# Patient Record
Sex: Male | Born: 1960 | Race: Black or African American | Hispanic: No | Marital: Married | Smoking: Never smoker
Health system: Southern US, Community
[De-identification: ages and names within clinical notes are randomized; demographics above are authoritative.]

## PROBLEM LIST (undated history)

## (undated) DIAGNOSIS — R569 Unspecified convulsions: Secondary | ICD-10-CM

## (undated) DIAGNOSIS — R519 Headache, unspecified: Secondary | ICD-10-CM

## (undated) DIAGNOSIS — R51 Headache: Secondary | ICD-10-CM

## (undated) DIAGNOSIS — J40 Bronchitis, not specified as acute or chronic: Secondary | ICD-10-CM

## (undated) HISTORY — DX: Unspecified convulsions: R56.9

## (undated) HISTORY — PX: EYE SURGERY: SHX253

## (undated) HISTORY — DX: Bronchitis, not specified as acute or chronic: J40

## (undated) HISTORY — DX: Headache: R51

## (undated) HISTORY — DX: Headache, unspecified: R51.9

## (undated) HISTORY — PX: WISDOM TOOTH EXTRACTION: SHX21

---

## 1998-08-16 ENCOUNTER — Emergency Department (HOSPITAL_COMMUNITY): Admission: EM | Admit: 1998-08-16 | Discharge: 1998-08-16 | Payer: Self-pay | Admitting: Emergency Medicine

## 2000-06-27 ENCOUNTER — Encounter: Payer: Self-pay | Admitting: Internal Medicine

## 2000-06-27 ENCOUNTER — Ambulatory Visit (HOSPITAL_COMMUNITY): Admission: RE | Admit: 2000-06-27 | Discharge: 2000-06-27 | Payer: Self-pay | Admitting: Internal Medicine

## 2001-03-12 ENCOUNTER — Encounter: Payer: Self-pay | Admitting: Emergency Medicine

## 2001-03-12 ENCOUNTER — Emergency Department (HOSPITAL_COMMUNITY): Admission: EM | Admit: 2001-03-12 | Discharge: 2001-03-13 | Payer: Self-pay | Admitting: Emergency Medicine

## 2004-02-27 ENCOUNTER — Emergency Department (HOSPITAL_COMMUNITY): Admission: EM | Admit: 2004-02-27 | Discharge: 2004-02-27 | Payer: Self-pay | Admitting: Emergency Medicine

## 2006-05-30 ENCOUNTER — Encounter: Admission: RE | Admit: 2006-05-30 | Discharge: 2006-05-30 | Payer: Self-pay | Admitting: Family Medicine

## 2006-09-18 ENCOUNTER — Ambulatory Visit: Payer: Self-pay | Admitting: Family Medicine

## 2006-11-05 ENCOUNTER — Ambulatory Visit: Payer: Self-pay | Admitting: Family Medicine

## 2007-04-07 ENCOUNTER — Encounter: Admission: RE | Admit: 2007-04-07 | Discharge: 2007-04-07 | Payer: Self-pay | Admitting: Internal Medicine

## 2007-04-07 ENCOUNTER — Ambulatory Visit: Payer: Self-pay | Admitting: Internal Medicine

## 2007-05-06 ENCOUNTER — Ambulatory Visit: Payer: Self-pay | Admitting: Internal Medicine

## 2007-05-06 DIAGNOSIS — L02818 Cutaneous abscess of other sites: Secondary | ICD-10-CM

## 2007-05-06 DIAGNOSIS — J029 Acute pharyngitis, unspecified: Secondary | ICD-10-CM

## 2007-05-06 DIAGNOSIS — L03818 Cellulitis of other sites: Secondary | ICD-10-CM

## 2007-05-20 ENCOUNTER — Ambulatory Visit: Payer: Self-pay | Admitting: Family Medicine

## 2007-05-20 DIAGNOSIS — L255 Unspecified contact dermatitis due to plants, except food: Secondary | ICD-10-CM

## 2007-09-04 ENCOUNTER — Ambulatory Visit: Payer: Self-pay | Admitting: Family Medicine

## 2007-09-04 DIAGNOSIS — L259 Unspecified contact dermatitis, unspecified cause: Secondary | ICD-10-CM

## 2007-09-30 ENCOUNTER — Ambulatory Visit: Payer: Self-pay | Admitting: Family Medicine

## 2008-05-11 ENCOUNTER — Ambulatory Visit: Payer: Self-pay | Admitting: Family Medicine

## 2008-05-11 DIAGNOSIS — H6690 Otitis media, unspecified, unspecified ear: Secondary | ICD-10-CM | POA: Insufficient documentation

## 2012-01-22 ENCOUNTER — Encounter: Payer: Self-pay | Admitting: Internal Medicine

## 2012-01-22 ENCOUNTER — Ambulatory Visit (INDEPENDENT_AMBULATORY_CARE_PROVIDER_SITE_OTHER): Payer: BC Managed Care – PPO | Admitting: Internal Medicine

## 2012-01-22 VITALS — BP 126/80 | HR 81 | Temp 98.2°F | Wt 210.8 lb

## 2012-01-22 DIAGNOSIS — L259 Unspecified contact dermatitis, unspecified cause: Secondary | ICD-10-CM

## 2012-01-22 DIAGNOSIS — H02849 Edema of unspecified eye, unspecified eyelid: Secondary | ICD-10-CM

## 2012-01-22 DIAGNOSIS — H02842 Edema of right lower eyelid: Secondary | ICD-10-CM

## 2012-01-22 NOTE — Patient Instructions (Signed)
Take a nonsedating allergy medicine such as Allegra 160 mg daily and apply Cortaid twice a day to the itchy area. Report warning signs as discussed; pain, pus, and fever.

## 2012-01-22 NOTE — Progress Notes (Signed)
  Subjective:    Patient ID: Manuel Foster, male    DOB: 25-Jan-1961, 51 y.o.   MRN: [id number]  HPI 01/19/2012 he noted itching of the right lower lid. After scratching it he noted swelling which has progressed over the last several days. He also noted some soreness inside the right ear without discharge. His wife did rub lanacaine  on the lower lid because of the itching.  He had a similar episode in the left eye area while living in the [place] in 2010. He received a shot of an unknown medication which resolved the symptoms  He denies any blurred vision, double vision or loss of vision. He's had no purulent discharge from the eye or fever, chills, or sweats. He also denies frontal headache, facial pain, nasal periods, dental pain, or sore throat.  He's had no swelling of his lips or tongue. He's also had no sneezing,wheezing or shortness of breath  He has not been taking nonsteroidals and is not on ACE inhibitor   Review of Systems     Objective:   Physical Exam  General appearance:good health ;well nourished; no acute distress or increased work of breathing is present.  No  lymphadenopathy about the head, neck, or axilla noted.   Eyes: No conjunctival inflammation ; mild OD lower lid edema is present. There is a mild granular texture to the lower lid. Extraocular motion is intact as is vision direct testing. There is slight ptosis on the left. There is no scleral icterus.  Ears:  External ear exam shows no significant lesions or deformities.  Otoscopic examination reveals clear canals, tympanic membranes are intact bilaterally without bulging, retraction, inflammation or discharge.  Nose:  External nasal examination shows no deformity or inflammation. Nasal mucosa are pink and moist without lesions or exudates. No septal dislocation or deviation.No obstruction to airflow.   Oral exam: Dental hygiene is good; lips and gums are healthy appearing.There is no oropharyngeal erythema or  exudate noted.   Neck:  No deformities, thyromegaly, masses, or tenderness noted.   Supple with full range of motion without pain.   Heart:  Normal rate and regular rhythm. S1 and S2 normal without gallop, murmur, click, rub or other extra sounds.   Lungs:Chest clear to auscultation; no wheezes, rhonchi,rales ,or rubs present.No increased work of breathing.    Extremities:  No cyanosis, edema, or clubbing  noted    Skin: Warm & dry ; see OD lower lid changes       Assessment & Plan:   #1 itching edema of the right lower lid; superimposed granular changes are most likely related to the topical Lanacane application. There is no evidence of conjunctivitis or significant angioedema.  Plan: See orders & recommendations

## 2014-09-20 ENCOUNTER — Ambulatory Visit: Payer: BC Managed Care – PPO | Admitting: Internal Medicine

## 2014-11-04 ENCOUNTER — Ambulatory Visit: Payer: BC Managed Care – PPO | Admitting: Internal Medicine

## 2014-12-06 ENCOUNTER — Ambulatory Visit: Payer: BC Managed Care – PPO | Admitting: Internal Medicine

## 2015-02-10 ENCOUNTER — Telehealth: Payer: Self-pay

## 2015-02-10 NOTE — Telephone Encounter (Signed)
Left a message for call back.  

## 2015-02-13 ENCOUNTER — Encounter: Payer: Self-pay | Admitting: Family Medicine

## 2015-02-13 ENCOUNTER — Ambulatory Visit (INDEPENDENT_AMBULATORY_CARE_PROVIDER_SITE_OTHER): Payer: BLUE CROSS/BLUE SHIELD | Admitting: Family Medicine

## 2015-02-13 VITALS — BP 136/84 | HR 63 | Temp 98.3°F | Ht 67.0 in | Wt 198.8 lb

## 2015-02-13 DIAGNOSIS — Z23 Encounter for immunization: Secondary | ICD-10-CM

## 2015-02-13 DIAGNOSIS — Z0000 Encounter for general adult medical examination without abnormal findings: Secondary | ICD-10-CM

## 2015-02-13 DIAGNOSIS — R195 Other fecal abnormalities: Secondary | ICD-10-CM

## 2015-02-13 NOTE — Patient Instructions (Signed)
Preventive Care for Adults A healthy lifestyle and preventive care can promote health and wellness. Preventive health guidelines for men include the following key practices:  A routine yearly physical is a good way to check with your health care provider about your health and preventative screening. It is a chance to share any concerns and updates on your health and to receive a thorough exam.  Visit your dentist for a routine exam and preventative care every 6 months. Brush your teeth twice a day and floss once a day. Good oral hygiene prevents tooth decay and gum disease.  The frequency of eye exams is based on your age, health, family medical history, use of contact lenses, and other factors. Follow your health care provider's recommendations for frequency of eye exams.  Eat a healthy diet. Foods such as vegetables, fruits, whole grains, low-fat dairy products, and lean protein foods contain the nutrients you need without too many calories. Decrease your intake of foods high in solid fats, added sugars, and salt. Eat the right amount of calories for you.Get information about a proper diet from your health care provider, if necessary.  Regular physical exercise is one of the most important things you can do for your health. Most adults should get at least 150 minutes of moderate-intensity exercise (any activity that increases your heart rate and causes you to sweat) each week. In addition, most adults need muscle-strengthening exercises on 2 or more days a week.  Maintain a healthy weight. The body mass index (BMI) is a screening tool to identify possible weight problems. It provides an estimate of body fat based on height and weight. Your health care provider can find your BMI and can help you achieve or maintain a healthy weight.For adults 20 years and older:  A BMI below 18.5 is considered underweight.  A BMI of 18.5 to 24.9 is normal.  A BMI of 25 to 29.9 is considered overweight.  A BMI  of 30 and above is considered obese.  Maintain normal blood lipids and cholesterol levels by exercising and minimizing your intake of saturated fat. Eat a balanced diet with plenty of fruit and vegetables. Blood tests for lipids and cholesterol should begin at age 50 and be repeated every 5 years. If your lipid or cholesterol levels are high, you are over 50, or you are at high risk for heart disease, you may need your cholesterol levels checked more frequently.Ongoing high lipid and cholesterol levels should be treated with medicines if diet and exercise are not working.  If you smoke, find out from your health care provider how to quit. If you do not use tobacco, do not start.  Lung cancer screening is recommended for adults aged 73-80 years who are at high risk for developing lung cancer because of a history of smoking. A yearly low-dose CT scan of the lungs is recommended for people who have at least a 30-pack-year history of smoking and are a current smoker or have quit within the past 15 years. A pack year of smoking is smoking an average of 1 pack of cigarettes a day for 1 year (for example: 1 pack a day for 30 years or 2 packs a day for 15 years). Yearly screening should continue until the smoker has stopped smoking for at least 15 years. Yearly screening should be stopped for people who develop a health problem that would prevent them from having lung cancer treatment.  If you choose to drink alcohol, do not have more than  2 drinks per day. One drink is considered to be 12 ounces (355 mL) of beer, 5 ounces (148 mL) of wine, or 1.5 ounces (44 mL) of liquor.  Avoid use of street drugs. Do not share needles with anyone. Ask for help if you need support or instructions about stopping the use of drugs.  High blood pressure causes heart disease and increases the risk of stroke. Your blood pressure should be checked at least every 1-2 years. Ongoing high blood pressure should be treated with  medicines, if weight loss and exercise are not effective.  If you are 45-79 years old, ask your health care provider if you should take aspirin to prevent heart disease.  Diabetes screening involves taking a blood sample to check your fasting blood sugar level. This should be done once every 3 years, after age 45, if you are within normal weight and without risk factors for diabetes. Testing should be considered at a younger age or be carried out more frequently if you are overweight and have at least 1 risk factor for diabetes.  Colorectal cancer can be detected and often prevented. Most routine colorectal cancer screening begins at the age of 50 and continues through age 75. However, your health care provider may recommend screening at an earlier age if you have risk factors for colon cancer. On a yearly basis, your health care provider may provide home test kits to check for hidden blood in the stool. Use of a small camera at the end of a tube to directly examine the colon (sigmoidoscopy or colonoscopy) can detect the earliest forms of colorectal cancer. Talk to your health care provider about this at age 50, when routine screening begins. Direct exam of the colon should be repeated every 5-10 years through age 75, unless early forms of precancerous polyps or small growths are found.  People who are at an increased risk for hepatitis B should be screened for this virus. You are considered at high risk for hepatitis B if:  You were born in a country where hepatitis B occurs often. Talk with your health care provider about which countries are considered high risk.  Your parents were born in a high-risk country and you have not received a shot to protect against hepatitis B (hepatitis B vaccine).  You have HIV or AIDS.  You use needles to inject street drugs.  You live with, or have sex with, someone who has hepatitis B.  You are a man who has sex with other men (MSM).  You get hemodialysis  treatment.  You take certain medicines for conditions such as cancer, organ transplantation, and autoimmune conditions.  Hepatitis C blood testing is recommended for all people born from 1945 through 1965 and any individual with known risks for hepatitis C.  Practice safe sex. Use condoms and avoid high-risk sexual practices to reduce the spread of sexually transmitted infections (STIs). STIs include gonorrhea, chlamydia, syphilis, trichomonas, herpes, HPV, and human immunodeficiency virus (HIV). Herpes, HIV, and HPV are viral illnesses that have no cure. They can result in disability, cancer, and death.  If you are at risk of being infected with HIV, it is recommended that you take a prescription medicine daily to prevent HIV infection. This is called preexposure prophylaxis (PrEP). You are considered at risk if:  You are a man who has sex with other men (MSM) and have other risk factors.  You are a heterosexual man, are sexually active, and are at increased risk for HIV infection.    You take drugs by injection.  You are sexually active with a partner who has HIV.  Talk with your health care provider about whether you are at high risk of being infected with HIV. If you choose to begin PrEP, you should first be tested for HIV. You should then be tested every 3 months for as long as you are taking PrEP.  A one-time screening for abdominal aortic aneurysm (AAA) and surgical repair of large AAAs by ultrasound are recommended for men ages 32 to 67 years who are current or former smokers.  Healthy men should no longer receive prostate-specific antigen (PSA) blood tests as part of routine cancer screening. Talk with your health care provider about prostate cancer screening.  Testicular cancer screening is not recommended for adult males who have no symptoms. Screening includes self-exam, a health care provider exam, and other screening tests. Consult with your health care provider about any symptoms  you have or any concerns you have about testicular cancer.  Use sunscreen. Apply sunscreen liberally and repeatedly throughout the day. You should seek shade when your shadow is shorter than you. Protect yourself by wearing long sleeves, pants, a wide-brimmed hat, and sunglasses year round, whenever you are outdoors.  Once a month, do a whole-body skin exam, using a mirror to look at the skin on your back. Tell your health care provider about new moles, moles that have irregular borders, moles that are larger than a pencil eraser, or moles that have changed in shape or color.  Stay current with required vaccines (immunizations).  Influenza vaccine. All adults should be immunized every year.  Tetanus, diphtheria, and acellular pertussis (Td, Tdap) vaccine. An adult who has not previously received Tdap or who does not know his vaccine status should receive 1 dose of Tdap. This initial dose should be followed by tetanus and diphtheria toxoids (Td) booster doses every 10 years. Adults with an unknown or incomplete history of completing a 3-dose immunization series with Td-containing vaccines should begin or complete a primary immunization series including a Tdap dose. Adults should receive a Td booster every 10 years.  Varicella vaccine. An adult without evidence of immunity to varicella should receive 2 doses or a second dose if he has previously received 1 dose.  Human papillomavirus (HPV) vaccine. Males aged 68-21 years who have not received the vaccine previously should receive the 3-dose series. Males aged 22-26 years may be immunized. Immunization is recommended through the age of 6 years for any male who has sex with males and did not get any or all doses earlier. Immunization is recommended for any person with an immunocompromised condition through the age of 49 years if he did not get any or all doses earlier. During the 3-dose series, the second dose should be obtained 4-8 weeks after the first  dose. The third dose should be obtained 24 weeks after the first dose and 16 weeks after the second dose.  Zoster vaccine. One dose is recommended for adults aged 50 years or older unless certain conditions are present.  Measles, mumps, and rubella (MMR) vaccine. Adults born before [birth year] generally are considered immune to measles and mumps. Adults born in [birth year] or later should have 1 or more doses of MMR vaccine unless there is a contraindication to the vaccine or there is laboratory evidence of immunity to each of the three diseases. A routine second dose of MMR vaccine should be obtained at least 28 days after the first dose for students attending postsecondary  schools, health care workers, or international travelers. People who received inactivated measles vaccine or an unknown type of measles vaccine during 1963-1967 should receive 2 doses of MMR vaccine. People who received inactivated mumps vaccine or an unknown type of mumps vaccine before 1979 and are at high risk for mumps infection should consider immunization with 2 doses of MMR vaccine. Unvaccinated health care workers born before 1957 who lack laboratory evidence of measles, mumps, or rubella immunity or laboratory confirmation of disease should consider measles and mumps immunization with 2 doses of MMR vaccine or rubella immunization with 1 dose of MMR vaccine.  Pneumococcal 13-valent conjugate (PCV13) vaccine. When indicated, a person who is uncertain of his immunization history and has no record of immunization should receive the PCV13 vaccine. An adult aged 19 years or older who has certain medical conditions and has not been previously immunized should receive 1 dose of PCV13 vaccine. This PCV13 should be followed with a dose of pneumococcal polysaccharide (PPSV23) vaccine. The PPSV23 vaccine dose should be obtained at least 8 weeks after the dose of PCV13 vaccine. An adult aged 19 years or older who has certain medical conditions and  previously received 1 or more doses of PPSV23 vaccine should receive 1 dose of PCV13. The PCV13 vaccine dose should be obtained 1 or more years after the last PPSV23 vaccine dose.  Pneumococcal polysaccharide (PPSV23) vaccine. When PCV13 is also indicated, PCV13 should be obtained first. All adults aged 65 years and older should be immunized. An adult younger than age 65 years who has certain medical conditions should be immunized. Any person who resides in a nursing home or long-term care facility should be immunized. An adult smoker should be immunized. People with an immunocompromised condition and certain other conditions should receive both PCV13 and PPSV23 vaccines. People with human immunodeficiency virus (HIV) infection should be immunized as soon as possible after diagnosis. Immunization during chemotherapy or radiation therapy should be avoided. Routine use of PPSV23 vaccine is not recommended for American Indians, [place] Natives, or people younger than 65 years unless there are medical conditions that require PPSV23 vaccine. When indicated, people who have unknown immunization and have no record of immunization should receive PPSV23 vaccine. One-time revaccination 5 years after the first dose of PPSV23 is recommended for people aged 19-64 years who have chronic kidney failure, nephrotic syndrome, asplenia, or immunocompromised conditions. People who received 1-2 doses of PPSV23 before age 65 years should receive another dose of PPSV23 vaccine at age 65 years or later if at least 5 years have passed since the previous dose. Doses of PPSV23 are not needed for people immunized with PPSV23 at or after age 65 years.  Meningococcal vaccine. Adults with asplenia or persistent complement component deficiencies should receive 2 doses of quadrivalent meningococcal conjugate (MenACWY-D) vaccine. The doses should be obtained at least 2 months apart. Microbiologists working with certain meningococcal bacteria,  military recruits, people at risk during an outbreak, and people who travel to or live in countries with a high rate of meningitis should be immunized. A first-year college student up through age 21 years who is living in a residence hall should receive a dose if he did not receive a dose on or after his 16th birthday. Adults who have certain high-risk conditions should receive one or more doses of vaccine.  Hepatitis A vaccine. Adults who wish to be protected from this disease, have certain high-risk conditions, work with hepatitis A-infected animals, work in hepatitis A research labs, or   travel to or work in countries with a high rate of hepatitis A should be immunized. Adults who were previously unvaccinated and who anticipate close contact with an international adoptee during the first 60 days after arrival in the [place] from a country with a high rate of hepatitis A should be immunized.  Hepatitis B vaccine. Adults should be immunized if they wish to be protected from this disease, have certain high-risk conditions, may be exposed to blood or other infectious body fluids, are household contacts or sex partners of hepatitis B positive people, are clients or workers in certain care facilities, or travel to or work in countries with a high rate of hepatitis B.  Haemophilus influenzae type b (Hib) vaccine. A previously unvaccinated person with asplenia or sickle cell disease or having a scheduled splenectomy should receive 1 dose of Hib vaccine. Regardless of previous immunization, a recipient of a hematopoietic stem cell transplant should receive a 3-dose series 6-12 months after his successful transplant. Hib vaccine is not recommended for adults with HIV infection. Preventive Service / Frequency Ages 52 to 17  Blood pressure check.** / Every 1 to 2 years.  Lipid and cholesterol check.** / Every 5 years beginning at age 69.  Hepatitis C blood test.** / For any individual with known risks for  hepatitis C.  Skin self-exam. / Monthly.  Influenza vaccine. / Every year.  Tetanus, diphtheria, and acellular pertussis (Tdap, Td) vaccine.** / Consult your health care provider. 1 dose of Td every 10 years.  Varicella vaccine.** / Consult your health care provider.  HPV vaccine. / 3 doses over 6 months, if 72 or younger.  Measles, mumps, rubella (MMR) vaccine.** / You need at least 1 dose of MMR if you were born in 1957 or later. You may also need a second dose.  Pneumococcal 13-valent conjugate (PCV13) vaccine.** / Consult your health care provider.  Pneumococcal polysaccharide (PPSV23) vaccine.** / 1 to 2 doses if you smoke cigarettes or if you have certain conditions.  Meningococcal vaccine.** / 1 dose if you are age 35 to 60 years and a Market researcher living in a residence hall, or have one of several medical conditions. You may also need additional booster doses.  Hepatitis A vaccine.** / Consult your health care provider.  Hepatitis B vaccine.** / Consult your health care provider.  Haemophilus influenzae type b (Hib) vaccine.** / Consult your health care provider. Ages 35 to 8  Blood pressure check.** / Every 1 to 2 years.  Lipid and cholesterol check.** / Every 5 years beginning at age 57.  Lung cancer screening. / Every year if you are aged 44-80 years and have a 30-pack-year history of smoking and currently smoke or have quit within the past 15 years. Yearly screening is stopped once you have quit smoking for at least 15 years or develop a health problem that would prevent you from having lung cancer treatment.  Fecal occult blood test (FOBT) of stool. / Every year beginning at age 55 and continuing until age 73. You may not have to do this test if you get a colonoscopy every 10 years.  Flexible sigmoidoscopy** or colonoscopy.** / Every 5 years for a flexible sigmoidoscopy or every 10 years for a colonoscopy beginning at age 28 and continuing until age  1.  Hepatitis C blood test.** / For all people born from [birth year] through 1965 and any individual with known risks for hepatitis C.  Skin self-exam. / Monthly.  Influenza vaccine. / Every  year.  Tetanus, diphtheria, and acellular pertussis (Tdap/Td) vaccine.** / Consult your health care provider. 1 dose of Td every 10 years.  Varicella vaccine.** / Consult your health care provider.  Zoster vaccine.** / 1 dose for adults aged 53 years or older.  Measles, mumps, rubella (MMR) vaccine.** / You need at least 1 dose of MMR if you were born in 1957 or later. You may also need a second dose.  Pneumococcal 13-valent conjugate (PCV13) vaccine.** / Consult your health care provider.  Pneumococcal polysaccharide (PPSV23) vaccine.** / 1 to 2 doses if you smoke cigarettes or if you have certain conditions.  Meningococcal vaccine.** / Consult your health care provider.  Hepatitis A vaccine.** / Consult your health care provider.  Hepatitis B vaccine.** / Consult your health care provider.  Haemophilus influenzae type b (Hib) vaccine.** / Consult your health care provider. Ages 77 and over  Blood pressure check.** / Every 1 to 2 years.  Lipid and cholesterol check.**/ Every 5 years beginning at age 85.  Lung cancer screening. / Every year if you are aged 55-80 years and have a 30-pack-year history of smoking and currently smoke or have quit within the past 15 years. Yearly screening is stopped once you have quit smoking for at least 15 years or develop a health problem that would prevent you from having lung cancer treatment.  Fecal occult blood test (FOBT) of stool. / Every year beginning at age 33 and continuing until age 11. You may not have to do this test if you get a colonoscopy every 10 years.  Flexible sigmoidoscopy** or colonoscopy.** / Every 5 years for a flexible sigmoidoscopy or every 10 years for a colonoscopy beginning at age 28 and continuing until age 73.  Hepatitis C blood  test.** / For all people born from [birth year] through 1965 and any individual with known risks for hepatitis C.  Abdominal aortic aneurysm (AAA) screening.** / A one-time screening for ages 50 to 27 years who are current or former smokers.  Skin self-exam. / Monthly.  Influenza vaccine. / Every year.  Tetanus, diphtheria, and acellular pertussis (Tdap/Td) vaccine.** / 1 dose of Td every 10 years.  Varicella vaccine.** / Consult your health care provider.  Zoster vaccine.** / 1 dose for adults aged 34 years or older.  Pneumococcal 13-valent conjugate (PCV13) vaccine.** / Consult your health care provider.  Pneumococcal polysaccharide (PPSV23) vaccine.** / 1 dose for all adults aged 63 years and older.  Meningococcal vaccine.** / Consult your health care provider.  Hepatitis A vaccine.** / Consult your health care provider.  Hepatitis B vaccine.** / Consult your health care provider.  Haemophilus influenzae type b (Hib) vaccine.** / Consult your health care provider. **Family history and personal history of risk and conditions may change your health care provider's recommendations. Document Released: 01/14/2002 Document Revised: 11/23/2013 Document Reviewed: 04/15/2011 [facility] Patient Information 2015 [place], [place]. This information is not intended to replace advice given to you by your health care provider. Make sure you discuss any questions you have with your health care provider.

## 2015-02-13 NOTE — Progress Notes (Signed)
Patient ID: Manuel Foster, male    DOB: Feb 19, 1961  Age: 54 y.o. MRN: [id number]    Subjective:  Subjective HPI EDDER Foster presents for cpe  Review of Systems  Constitutional: Negative.   HENT: Negative for congestion, ear pain, hearing loss, nosebleeds, postnasal drip, rhinorrhea, sinus pressure, sneezing and tinnitus.   Eyes: Negative for photophobia, discharge, itching and visual disturbance.  Respiratory: Negative.   Cardiovascular: Negative.   Gastrointestinal: Negative for abdominal pain, constipation, blood in stool, abdominal distention and anal bleeding.  Endocrine: Negative.   Genitourinary: Negative.   Musculoskeletal: Negative.   Skin: Negative.   Allergic/Immunologic: Negative.   Neurological: Negative for dizziness, weakness, light-headedness, numbness and headaches.  Psychiatric/Behavioral: Negative for suicidal ideas, confusion, sleep disturbance, dysphoric mood, decreased concentration and agitation. The patient is not nervous/anxious.     History Past Medical History  Diagnosis Date  . Chicken pox   . Frequent headaches   . Seizures     As a child    He has past surgical history that includes Eye surgery (Left) and Wisdom tooth extraction.   His family history includes Alcohol abuse in his brother and brother; Diabetes in his father and sister; Stroke in his mother.He reports that he has never smoked. He does not have any smokeless tobacco history on file. He reports that he does not drink alcohol or use illicit drugs.  No current outpatient prescriptions on file prior to visit.   No current facility-administered medications on file prior to visit.     Objective:  Objective Physical Exam  Constitutional: He is oriented to person, place, and time. He appears well-developed and well-nourished. No distress.  HENT:  Head: Normocephalic and atraumatic.  Right Ear: External ear normal.  Left Ear: External ear normal.  Nose: Nose normal.    Mouth/Throat: Oropharynx is clear and moist. No oropharyngeal exudate.  Eyes: Conjunctivae and EOM are normal. Pupils are equal, round, and reactive to light. Right eye exhibits no discharge. Left eye exhibits no discharge.  Neck: Normal range of motion. Neck supple. No JVD present. No thyromegaly present.  Cardiovascular: Normal rate, regular rhythm, normal heart sounds and intact distal pulses.  Exam reveals no gallop and no friction rub.   No murmur heard. Pulmonary/Chest: Effort normal and breath sounds normal. No respiratory distress. He has no wheezes. He has no rales. He exhibits no tenderness.  Abdominal: Soft. Bowel sounds are normal. He exhibits no distension and no mass. There is no tenderness. There is no rebound and no guarding.  Genitourinary: Prostate normal and penis normal. Guaiac positive stool.  Musculoskeletal: Normal range of motion. He exhibits no edema or tenderness.  Lymphadenopathy:    He has no cervical adenopathy.  Neurological: He is alert and oriented to person, place, and time. He displays normal reflexes. He exhibits normal muscle tone.  Skin: Skin is warm and dry. No rash noted. He is not diaphoretic. No erythema. No pallor.  Psychiatric: He has a normal mood and affect. His behavior is normal. Judgment and thought content normal.   BP 136/84 mmHg  Pulse 63  Temp(Src) 98.3 F (36.8 C) (Oral)  Ht  (1.702 m)  Wt 198 lb 12.8 oz (90.175 kg)  BMI 31.13 kg/m2  SpO2 97% Wt Readings from Last 3 Encounters:  02/13/15 198 lb 12.8 oz (90.175 kg)  01/22/12 210 lb 12.8 oz (95.618 kg)  05/11/08 201 lb 8 oz (91.4 kg)     No results found for: WBC, HGB, HCT,  PLT, GLUCOSE, CHOL, TRIG, HDL, LDLDIRECT, LDLCALC, ALT, AST, NA, K, CL, CREATININE, BUN, CO2, TSH, PSA, INR, GLUF, HGBA1C, MICROALBUR  No results found.   Assessment & Plan:  Plan Manuel Foster does not currently have medications on file.  No orders of the defined types were placed in this encounter.     Problem List Items Addressed This Visit    None    Visit Diagnoses    Preventative health care    -  Primary    Relevant Orders    Basic metabolic panel    CBC with Differential/Platelet    Hepatic function panel    Lipid panel    POCT urinalysis dipstick    TSH    Need for diphtheria-tetanus-pertussis (Tdap) vaccine, adult/adolescent        Relevant Orders    Tdap vaccine greater than or equal to 7yo IM (Completed)    Heme positive stool        Relevant Orders    Ambulatory referral to Gastroenterology    Encounter for immunization           Follow-up: Return in about 1 year (around 02/13/2016), or if symptoms worsen or fail to improve, for labs in next few weeks.  Manuel FreudYvonne Lowne, DO

## 2015-02-13 NOTE — Progress Notes (Signed)
Pre visit review using our clinic review tool, if applicable. No additional management support is needed unless otherwise documented below in the visit note. 

## 2015-02-16 NOTE — Telephone Encounter (Signed)
Unable to reach patient prior to visit  

## 2015-03-01 ENCOUNTER — Other Ambulatory Visit: Payer: BLUE CROSS/BLUE SHIELD

## 2015-03-07 ENCOUNTER — Encounter: Payer: Self-pay | Admitting: Family Medicine

## 2015-03-07 ENCOUNTER — Ambulatory Visit (INDEPENDENT_AMBULATORY_CARE_PROVIDER_SITE_OTHER): Payer: BLUE CROSS/BLUE SHIELD | Admitting: Family Medicine

## 2015-03-07 ENCOUNTER — Other Ambulatory Visit (INDEPENDENT_AMBULATORY_CARE_PROVIDER_SITE_OTHER): Payer: BLUE CROSS/BLUE SHIELD

## 2015-03-07 VITALS — BP 126/80 | HR 69 | Temp 98.1°F | Resp 16 | Ht 67.0 in | Wt 195.0 lb

## 2015-03-07 DIAGNOSIS — R208 Other disturbances of skin sensation: Secondary | ICD-10-CM

## 2015-03-07 DIAGNOSIS — R200 Anesthesia of skin: Secondary | ICD-10-CM

## 2015-03-07 DIAGNOSIS — Z0000 Encounter for general adult medical examination without abnormal findings: Secondary | ICD-10-CM

## 2015-03-07 NOTE — Progress Notes (Signed)
Pre visit review using our clinic review tool, if applicable. No additional management support is needed unless otherwise documented below in the visit note. 

## 2015-03-07 NOTE — Progress Notes (Signed)
   Subjective:    Patient ID: Manuel HumanRandy D Ruda, male    DOB: Oct 05, 1961, 54 y.o.   MRN: [id number]  HPI  Patient here c/o numbness in L hand 3 and 4 fingers-- mostly 3rd No injury---noticed it a few weeks ago--worsening since  Past Medical History  Diagnosis Date  . Chicken pox   . Frequent headaches   . Seizures     As a child    Review of Systems  Constitutional: Negative for fatigue and unexpected weight change.  Respiratory: Negative for cough and shortness of breath.   Cardiovascular: Negative for chest pain and palpitations.  Neurological: Positive for numbness.    No current outpatient prescriptions on file prior to visit.   No current facility-administered medications on file prior to visit.       Objective:    Physical Exam  Constitutional: He appears well-developed and well-nourished. No distress.  Cardiovascular: Normal rate, regular rhythm and normal heart sounds.   Pulmonary/Chest: Effort normal and breath sounds normal. No respiratory distress.  Musculoskeletal:       Arms: Psychiatric: He has a normal mood and affect. His behavior is normal. Judgment and thought content normal.    BP 126/80 mmHg  Pulse 69  Temp(Src) 98.1 F (36.7 C) (Oral)  Resp 16  Ht 5\' 7"  (1.702 m)  Wt 195 lb (88.451 kg)  BMI 30.53 kg/m2  SpO2 98% Wt Readings from Last 3 Encounters:  03/07/15 195 lb (88.451 kg)  02/13/15 198 lb 12.8 oz (90.175 kg)  01/22/12 210 lb 12.8 oz (95.618 kg)     No results found for: WBC, HGB, HCT, PLT, GLUCOSE, CHOL, TRIG, HDL, LDLDIRECT, LDLCALC, ALT, AST, NA, K, CL, CREATININE, BUN, CO2, TSH, PSA, INR, GLUF, HGBA1C, MICROALBUR     Assessment & Plan:   Problem List Items Addressed This Visit    None    Visit Diagnoses    Numbness of left hand    -  Primary    Relevant Orders    Ambulatory referral to Hand Surgery       [name] does not currently have medications on file.  No orders of the defined types were placed in this  encounter.     Loreen FreudYvonne Lowne, DO

## 2015-03-08 LAB — HEPATIC FUNCTION PANEL
ALBUMIN: 3.9 g/dL (ref 3.5–5.2)
ALK PHOS: 58 U/L (ref 39–117)
ALT: 17 U/L (ref 0–53)
AST: 23 U/L (ref 0–37)
Bilirubin, Direct: 0.1 mg/dL (ref 0.0–0.3)
Total Bilirubin: 0.6 mg/dL (ref 0.2–1.2)
Total Protein: 7.5 g/dL (ref 6.0–8.3)

## 2015-03-08 LAB — BASIC METABOLIC PANEL
BUN: 14 mg/dL (ref 6–23)
CALCIUM: 9.4 mg/dL (ref 8.4–10.5)
CO2: 27 mEq/L (ref 19–32)
CREATININE: 0.94 mg/dL (ref 0.40–1.50)
Chloride: 104 mEq/L (ref 96–112)
GFR: 108.64 mL/min (ref >60.00)
GLUCOSE: 77 mg/dL (ref 70–99)
Potassium: 3.5 mEq/L (ref 3.5–5.1)
Sodium: 137 mEq/L (ref 135–145)

## 2015-03-08 LAB — LIPID PANEL
CHOL/HDL RATIO: 4
Cholesterol: 245 mg/dL — ABNORMAL HIGH (ref 0–200)
HDL: 59.3 mg/dL (ref >39.00)
LDL Cholesterol: 175 mg/dL — ABNORMAL HIGH (ref 0–99)
NONHDL: 185.7
Triglycerides: 52 mg/dL (ref 0.0–149.0)
VLDL: 10.4 mg/dL (ref 0.0–40.0)

## 2015-03-08 LAB — CBC WITH DIFFERENTIAL/PLATELET
BASOS ABS: 0.1 10*3/uL (ref 0.0–0.1)
Basophils Relative: 1.1 % (ref 0.0–3.0)
EOS ABS: 0.2 10*3/uL (ref 0.0–0.7)
Eosinophils Relative: 3.9 % (ref 0.0–5.0)
HEMATOCRIT: 37.8 % — AB (ref 39.0–52.0)
Hemoglobin: 12.8 g/dL — ABNORMAL LOW (ref 13.0–17.0)
LYMPHS ABS: 1.4 10*3/uL (ref 0.7–4.0)
Lymphocytes Relative: 32.7 % (ref 12.0–46.0)
MCHC: 33.9 g/dL (ref 30.0–36.0)
MCV: 82.8 fl (ref 78.0–100.0)
MONO ABS: 0.4 10*3/uL (ref 0.1–1.0)
Monocytes Relative: 9.3 % (ref 3.0–12.0)
Neutro Abs: 2.3 10*3/uL (ref 1.4–7.7)
Neutrophils Relative %: 53 % (ref 43.0–77.0)
PLATELETS: 222 10*3/uL (ref 150.0–400.0)
RBC: 4.56 Mil/uL (ref 4.22–5.81)
RDW: 14 % (ref 11.5–15.5)
WBC: 4.4 10*3/uL (ref 4.0–10.5)

## 2015-03-08 LAB — TSH: TSH: 1.25 u[IU]/mL (ref 0.35–4.50)

## 2015-04-24 ENCOUNTER — Ambulatory Visit (INDEPENDENT_AMBULATORY_CARE_PROVIDER_SITE_OTHER): Payer: BLUE CROSS/BLUE SHIELD | Admitting: Podiatry

## 2015-04-24 ENCOUNTER — Encounter: Payer: Self-pay | Admitting: Podiatry

## 2015-04-24 VITALS — BP 170/79 | HR 66 | Resp 13 | Ht 67.0 in | Wt 190.0 lb

## 2015-04-24 DIAGNOSIS — M2040 Other hammer toe(s) (acquired), unspecified foot: Secondary | ICD-10-CM

## 2015-04-24 DIAGNOSIS — L84 Corns and callosities: Secondary | ICD-10-CM

## 2015-04-24 MED ORDER — NAFTIFINE HCL 2 % EX CREA: TOPICAL_CREAM | CUTANEOUS | Status: DC

## 2015-04-24 NOTE — Progress Notes (Signed)
   Subjective:    Patient ID: Manuel Foster, male    DOB: [birth year], 54 y.o.   MRN: [id number]  HPI    Review of Systems  All other systems reviewed and are negative.      Objective:   Physical Exam        Assessment & Plan:

## 2015-04-25 NOTE — Progress Notes (Signed)
Subjective:     Patient ID: Manuel Foster, male   DOB: 1961/10/05, 54 y.o.   MRN: [id number]  HPI patient presents with corn callus formation on the medial side of the big toe of both feet and deviation of the hallux against the second toe bilateral along with keratotic lesion formation   Review of Systems     Objective:   Physical Exam Neurovascular status intact muscle strength adequate range of motion within normal limits. Patient's found to have distal keratotic lesion hallux bilateral that are thick and painful when pressed and deviation of the hallux bilateral    Assessment:     Structural changes with keratotic lesion formation and deviation of the big toe bilateral    Plan:     H&P and condition reviewed with patient. Debridement accomplished with no iatrogenic bleeding and instructed on padding therapy with pads been dispensed

## 2015-11-24 ENCOUNTER — Ambulatory Visit: Payer: BLUE CROSS/BLUE SHIELD | Admitting: Medical

## 2015-11-24 ENCOUNTER — Encounter: Payer: Self-pay | Admitting: Family Medicine

## 2015-11-24 ENCOUNTER — Ambulatory Visit (INDEPENDENT_AMBULATORY_CARE_PROVIDER_SITE_OTHER): Payer: BLUE CROSS/BLUE SHIELD | Admitting: Family Medicine

## 2015-11-24 VITALS — BP 140/89 | HR 74 | Temp 98.4°F | Ht 67.0 in | Wt 195.0 lb

## 2015-11-24 DIAGNOSIS — R05 Cough: Secondary | ICD-10-CM

## 2015-11-24 DIAGNOSIS — J0110 Acute frontal sinusitis, unspecified: Secondary | ICD-10-CM

## 2015-11-24 DIAGNOSIS — R059 Cough, unspecified: Secondary | ICD-10-CM

## 2015-11-24 MED ORDER — PROMETHAZINE-DM 6.25-15 MG/5ML PO SYRP: 5.0000 mL | ORAL_SOLUTION | Freq: Four times a day (QID) | ORAL | Status: DC | PRN

## 2015-11-24 MED ORDER — AMOXICILLIN-POT CLAVULANATE 875-125 MG PO TABS
1.0000 | ORAL_TABLET | Freq: Two times a day (BID) | ORAL | Status: DC
Start: 2015-11-24 — End: 2016-06-22

## 2015-11-24 MED ORDER — LEVOCETIRIZINE DIHYDROCHLORIDE 5 MG PO TABS: 5.0000 mg | ORAL_TABLET | Freq: Every evening | ORAL | Status: DC

## 2015-11-24 MED ORDER — FLUTICASONE PROPIONATE 50 MCG/ACT NA SUSP: 2.0000 | Freq: Every day | NASAL | Status: DC

## 2015-11-24 NOTE — Progress Notes (Signed)
Pre visit review using our clinic review tool, if applicable. No additional management support is needed unless otherwise documented below in the visit note. 

## 2015-11-24 NOTE — Progress Notes (Signed)
Patient ID: Manuel Foster, male    DOB: Apr 09, 1961  Age: 54 y.o. MRN: [id number]    Subjective:  Subjective HPI Manuel Foster presents for cough and congestion x 5 days.  Took robitussin and otc med with no relief.  No fever, no chills.  + sinus pressure  Review of Systems  Constitutional: Positive for chills. Negative for fever.  HENT: Positive for congestion, postnasal drip, rhinorrhea and sinus pressure.   Respiratory: Positive for cough. Negative for chest tightness, shortness of breath and wheezing.   Cardiovascular: Negative for chest pain, palpitations and leg swelling.  Allergic/Immunologic: Negative for environmental allergies.    History Past Medical History  Diagnosis Date  . Chicken pox   . Frequent headaches   . Seizures (HCC)     As a child    Manuel Foster has past surgical history that includes Eye surgery (Left) and Wisdom tooth extraction.   His family history includes Alcohol abuse in his brother and brother; Diabetes in his father and sister; Stroke in his mother.Manuel Foster reports that Manuel Foster has never smoked. Manuel Foster does not have any smokeless tobacco history on file. Manuel Foster reports that Manuel Foster does not drink alcohol or use illicit drugs.  Current Outpatient Prescriptions on File Prior to Visit  Medication Sig Dispense Refill  . Naftifine HCl 2 % CREA Apply daily to affected area. 1 Tube 11   No current facility-administered medications on file prior to visit.     Objective:  Objective Physical Exam  Constitutional: Manuel Foster is oriented to person, place, and time. Manuel Foster appears well-developed and well-nourished.  HENT:  Right Ear: External ear normal.  Left Ear: External ear normal.  Nose: Right sinus exhibits maxillary sinus tenderness and frontal sinus tenderness. Left sinus exhibits maxillary sinus tenderness and frontal sinus tenderness.  + PND + errythema  Eyes: Conjunctivae are normal. Right eye exhibits no discharge. Left eye exhibits no discharge.  Cardiovascular: Normal rate,  regular rhythm and normal heart sounds.   No murmur heard. Pulmonary/Chest: Effort normal and breath sounds normal. No respiratory distress. Manuel Foster has no wheezes. Manuel Foster has no rales. Manuel Foster exhibits no tenderness.  Musculoskeletal: Manuel Foster exhibits no edema.  Lymphadenopathy:    Manuel Foster has cervical adenopathy.  Neurological: Manuel Foster is alert and oriented to person, place, and time.  Psychiatric: Manuel Foster has a normal mood and affect. His behavior is normal. Judgment and thought content normal.  Nursing note and vitals reviewed.  BP 140/89 mmHg  Pulse 74  Temp(Src) 98.4 F (36.9 C) (Oral)  Ht  (1.702 m)  Wt 195 lb (88.451 kg)  BMI 30.53 kg/m2  SpO2 98% Wt Readings from Last 3 Encounters:  11/24/15 195 lb (88.451 kg)  04/24/15 190 lb (86.183 kg)  03/07/15 195 lb (88.451 kg)     Lab Results  Component Value Date   WBC 4.4 03/07/2015   HGB 12.8* 03/07/2015   HCT 37.8* 03/07/2015   PLT 222.0 03/07/2015   GLUCOSE 77 03/07/2015   CHOL 245* 03/07/2015   TRIG 52.0 03/07/2015   HDL 59.30 03/07/2015   LDLCALC 175* 03/07/2015   ALT 17 03/07/2015   AST 23 03/07/2015   NA 137 03/07/2015   K 3.5 03/07/2015   CL 104 03/07/2015   CREATININE 0.94 03/07/2015   BUN 14 03/07/2015   CO2 27 03/07/2015   TSH 1.25 03/07/2015    No results found.   Assessment & Plan:  Plan I am having Manuel Foster start on amoxicillin-clavulanate, fluticasone, levocetirizine, and promethazine-dextromethorphan. I am  also having him maintain his Naftifine HCl.  Meds ordered this encounter  Medications  . amoxicillin-clavulanate (AUGMENTIN) 875-125 MG tablet    Sig: Take 1 tablet by mouth 2 (two) times daily.    Dispense:  20 tablet    Refill:  0  . fluticasone (FLONASE) 50 MCG/ACT nasal spray    Sig: Place 2 sprays into both nostrils daily.    Dispense:  16 g    Refill:  6  . levocetirizine (XYZAL) 5 MG tablet    Sig: Take 1 tablet (5 mg total) by mouth every evening.    Dispense:  30 tablet    Refill:  5  .  promethazine-dextromethorphan (PROMETHAZINE-DM) 6.25-15 MG/5ML syrup    Sig: Take 5 mLs by mouth 4 (four) times daily as needed.    Dispense:  118 mL    Refill:  0    Problem List Items Addressed This Visit    None    Visit Diagnoses    Acute frontal sinusitis, recurrence not specified    -  Primary    Relevant Medications    amoxicillin-clavulanate (AUGMENTIN) 875-125 MG tablet    fluticasone (FLONASE) 50 MCG/ACT nasal spray    levocetirizine (XYZAL) 5 MG tablet    promethazine-dextromethorphan (PROMETHAZINE-DM) 6.25-15 MG/5ML syrup    Cough        Relevant Medications    promethazine-dextromethorphan (PROMETHAZINE-DM) 6.25-15 MG/5ML syrup       Follow-up: Return if symptoms worsen or fail to improve.  Loreen FreudYvonne Lowne, DO

## 2015-11-24 NOTE — Patient Instructions (Signed)

## 2016-01-11 ENCOUNTER — Other Ambulatory Visit: Payer: Self-pay

## 2016-01-11 DIAGNOSIS — J0110 Acute frontal sinusitis, unspecified: Secondary | ICD-10-CM

## 2016-01-11 MED ORDER — LEVOCETIRIZINE DIHYDROCHLORIDE 5 MG PO TABS: 5.0000 mg | ORAL_TABLET | Freq: Every evening | ORAL | Status: DC

## 2016-06-22 ENCOUNTER — Telehealth: Payer: Self-pay | Admitting: Emergency Medicine

## 2016-06-22 ENCOUNTER — Ambulatory Visit (INDEPENDENT_AMBULATORY_CARE_PROVIDER_SITE_OTHER): Payer: BLUE CROSS/BLUE SHIELD | Admitting: Urgent Care

## 2016-06-22 ENCOUNTER — Ambulatory Visit (HOSPITAL_COMMUNITY)
Admission: RE | Admit: 2016-06-22 | Discharge: 2016-06-22 | Disposition: A | Discharge status: Home / Self Care | Payer: BLUE CROSS/BLUE SHIELD | Source: Ambulatory Visit | Attending: Urgent Care | Admitting: Urgent Care

## 2016-06-22 VITALS — BP 110/64 | HR 56 | Temp 98.2°F | Resp 18 | Ht 67.0 in | Wt 189.6 lb

## 2016-06-22 DIAGNOSIS — M7989 Other specified soft tissue disorders: Secondary | ICD-10-CM

## 2016-06-22 DIAGNOSIS — M79661 Pain in right lower leg: Secondary | ICD-10-CM | POA: Diagnosis not present

## 2016-06-22 LAB — POCT CBC
Granulocyte percent: 57.2 %G (ref 37–80)
HCT, POC: 39.2 % — AB (ref 43.5–53.7)
HEMOGLOBIN: 13.3 g/dL — AB (ref 14.1–18.1)
LYMPH, POC: 2 (ref 0.6–3.4)
MCH: 28.1 pg (ref 27–31.2)
MCHC: 34 g/dL (ref 31.8–35.4)
MCV: 82.7 fL (ref 80–97)
MID (cbc): 0.6 (ref 0–0.9)
MPV: 6.4 fL (ref 0–99.8)
PLATELET COUNT, POC: 238 10*3/uL (ref 142–424)
POC Granulocyte: 3.4 (ref 2–6.9)
POC LYMPH PERCENT: 33.4 %L (ref 10–50)
POC MID %: 9.4 % (ref 0–12)
RBC: 4.74 M/uL (ref 4.69–6.13)
RDW, POC: 13.7 %
WBC: 6 10*3/uL (ref 4.6–10.2)

## 2016-06-22 LAB — COMPREHENSIVE METABOLIC PANEL
ALK PHOS: 64 U/L (ref 40–115)
ALT: 19 U/L (ref 9–46)
AST: 27 U/L (ref 10–35)
Albumin: 4.3 g/dL (ref 3.6–5.1)
BUN: 18 mg/dL (ref 7–25)
CO2: 26 mmol/L (ref 20–31)
CREATININE: 1.02 mg/dL (ref 0.70–1.33)
Calcium: 9.3 mg/dL (ref 8.6–10.3)
Chloride: 105 mmol/L (ref 98–110)
GLUCOSE: 90 mg/dL (ref 65–99)
POTASSIUM: 4.1 mmol/L (ref 3.5–5.3)
SODIUM: 138 mmol/L (ref 135–146)
TOTAL PROTEIN: 7.5 g/dL (ref 6.1–8.1)
Total Bilirubin: 0.6 mg/dL (ref 0.2–1.2)

## 2016-06-22 LAB — HIV ANTIBODY (ROUTINE TESTING W REFLEX): HIV: NONREACTIVE

## 2016-06-22 MED ORDER — CYCLOBENZAPRINE HCL 5 MG PO TABS: 5.0000 mg | ORAL_TABLET | Freq: Three times a day (TID) | ORAL | Status: DC | PRN

## 2016-06-22 MED ORDER — NAPROXEN SODIUM 550 MG PO TABS: 550.0000 mg | ORAL_TABLET | Freq: Two times a day (BID) | ORAL | Status: DC

## 2016-06-22 NOTE — Patient Instructions (Addendum)
  Go to [facility] and register in the emergency room. The tech will come get you from the lobby once you have checked in.  [address]   IF you received an x-ray today, you will receive an invoice from [facility]. Please contact [facility] at [phone] with questions or concerns regarding your invoice.   IF you received labwork today, you will receive an invoice from [employer]. Please contact [facility] at [phone] with questions or concerns regarding your invoice.   Our billing staff will not be able to assist you with questions regarding bills from these companies.  You will be contacted with the lab results as soon as they are available. The fastest way to get your results is to activate your My Chart account. Instructions are located on the last page of this paperwork. If you have not heard from [place] regarding the results in 2 weeks, please contact this office.

## 2016-06-22 NOTE — Addendum Note (Signed)
Addended by: Wallis Bamberg on: 06/22/2016 01:14 PM   Modules accepted: Orders

## 2016-06-22 NOTE — Progress Notes (Addendum)
MRN: [id number] DOB: 07-Nov-1961  Subjective:   Manuel Foster is a 55 y.o. male presenting for chief complaint of Leg Pain  Reports 3 day history of right lower leg pain, swelling. Pain is achy in nature, worse with walking but has this pain at rest too. Has not tried any medications for relief. 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 Of note, patient has had 1 episode in 1992 of sudden right lower leg pain that resolved on its own.  Denies fever, chest pain, shob, cough, redness, warmth of his calf, trauma, numbness or tingling. Denies recent history of surgeries, long distance travel. Denies smoking cigarettes or drinking alcohol. Drinks water, sodas, tea.   Manuel Foster has a current medication list which includes the following prescription(s): naftifine hcl. Also has No Known Allergies.  Manuel Foster  has a past medical history of Frequent headaches; Seizures (HCC); and Bronchitis. Also  has past surgical history that includes Eye surgery (Left) and Wisdom tooth extraction.  His family history includes Alcohol abuse in his brother and brother; Diabetes in his father and sister; Stroke in his mother.   Objective:   Vitals: BP 110/64 mmHg  Pulse 56  Temp(Src) 98.2 F (36.8 C) (Oral)  Resp 18  Ht  (1.702 m)  Wt 189 lb 9.6 oz (86.002 kg)  BMI 29.69 kg/m2  SpO2 98%  Physical Exam  Constitutional: He is oriented to person, place, and time. He appears well-developed and well-nourished.  Cardiovascular: Normal rate, regular rhythm and intact distal pulses.  Exam reveals no gallop and no friction rub.   No murmur heard. Pulmonary/Chest: No respiratory distress. He has no wheezes. He has no rales.  Musculoskeletal:       Right lower leg: He exhibits tenderness (calf, positive Homman sign) and swelling (1.5cm compared to left calf). He exhibits no bony tenderness, no deformity and no laceration.  Neurological: He is alert and oriented to person, place, and time.  Skin: Skin is warm and  dry.   Results for orders placed or performed in visit on 06/22/16 (from the past 24 hour(s))  POCT CBC     Status: Abnormal   Collection Time: 06/22/16 11:53 AM  Result Value Ref Range   WBC 6.0 4.6 - 10.2 K/uL   Lymph, poc 2.0 0.6 - 3.4   POC LYMPH PERCENT 33.4 10 - 50 %L   MID (cbc) 0.6 0 - 0.9   POC MID % 9.4 0 - 12 %M   POC Granulocyte 3.4 2 - 6.9   Granulocyte percent 57.2 37 - 80 %G   RBC 4.74 4.69 - 6.13 M/uL   Hemoglobin 13.3 (A) 14.1 - 18.1 g/dL   HCT, POC 40.9 (A) 81.1 - 53.7 %   MCV 82.7 80 - 97 fL   MCH, POC 28.1 27 - 31.2 pg   MCHC 34.0 31.8 - 35.4 g/dL   RDW, POC 91.4 %   Platelet Count, POC 238 142 - 424 K/uL   MPV 6.4 0 - 99.8 fL   Assessment and Plan :   1. Right calf pain 2. Right leg swelling - Unclear etiology, does not have typical symptoms suggestive of DVT but will r/o with stat U/S. Labs are pending, will manage conservatively for musculoskeletal type pain associated with the nature of his work lack of water intake if U/S is negative.   Wallis Bamberg, PA-C [facility] [phone] 06/22/2016 11:09 AM   UPDATE: Discussed  U/S results with Dr. Cleta Alberts regarding interstitial fluid seen in right lower extremity. Patient is to start Anaprox for pain and inflammation, wear compression stockings, use Flexeril as needed. RTC in 1 week, consider referral to ortho, recheck of cbc to r/o cellulitis. Patient declined work note for light duty.

## 2016-06-22 NOTE — Telephone Encounter (Signed)
Report given for negative DVT R leg with interstitial fluid Results given to provider  Instructions for pt to wait in ER for further instructions per provider

## 2016-06-22 NOTE — Progress Notes (Signed)
VASCULAR LAB PRELIMINARY  PRELIMINARY  PRELIMINARY  PRELIMINARY  Right lower extremity venous duplex completed.    Preliminary report:  There is no DVT or SVT noted in the right lower extremity.  There is interstitial fluid noted throughout the calf, mostly situated in the mid calf.   Adel Neyer, RVT 06/22/2016, 1:03 PM

## 2016-06-24 ENCOUNTER — Telehealth: Payer: Self-pay | Admitting: *Deleted

## 2016-06-24 ENCOUNTER — Encounter: Payer: Self-pay | Admitting: Urgent Care

## 2016-06-24 NOTE — Telephone Encounter (Signed)
[facility] note received via fax  Call:   Date: 06/22/16 Time: 0742   Caller: Kingdon Lenzini, spouse Return number: [phone]  Nurse: Rock Nephew, RN  Chief Complaint: Leg Pain  Reason for call: Caller states her husband has pain in back of right leg  Related visit to physician within the last 2 weeks: No  Guideline: Leg Pain; Thigh or calf pain AND only 1 side AND present > 1 hour  Disposition: See Physician within 4 Hours   **Pt seen at [facility] 06/22/16**

## 2016-06-29 ENCOUNTER — Ambulatory Visit (INDEPENDENT_AMBULATORY_CARE_PROVIDER_SITE_OTHER): Payer: BLUE CROSS/BLUE SHIELD | Admitting: Family Medicine

## 2016-06-29 VITALS — BP 128/80 | HR 79 | Temp 98.1°F | Resp 18 | Ht 67.0 in | Wt 191.6 lb

## 2016-06-29 DIAGNOSIS — D649 Anemia, unspecified: Secondary | ICD-10-CM

## 2016-06-29 DIAGNOSIS — M79661 Pain in right lower leg: Secondary | ICD-10-CM

## 2016-06-29 LAB — CBC
HEMATOCRIT: 38.6 % (ref 38.5–50.0)
Hemoglobin: 12.7 g/dL — ABNORMAL LOW (ref 13.2–17.1)
MCH: 28.2 pg (ref 27.0–33.0)
MCHC: 32.9 g/dL (ref 32.0–36.0)
MCV: 85.8 fL (ref 80.0–100.0)
MPV: 9 fL (ref 7.5–12.5)
Platelets: 242 10*3/uL (ref 140–400)
RBC: 4.5 MIL/uL (ref 4.20–5.80)
RDW: 14 % (ref 11.0–15.0)
WBC: 5.2 10*3/uL (ref 3.8–10.8)

## 2016-06-29 NOTE — Progress Notes (Signed)
Subjective:  By signing my name below, I, [name], attest that this documentation has been prepared under the direction and in the presence of Meredith Staggers, MD. Electronically Signed: [name], Scribe. 06/29/2016 , 4:01 PM .  Patient was seen in Room 13 .   Patient ID: Manuel Foster, male    DOB: 25-Jan-1961, 55 y.o.   MRN: [id number] Chief Complaint  Patient presents with   Follow-up    for right calf pain    HPI Manuel Foster is a 55 y.o. male Here for follow up right leg pain. He was seen a week ago (06/22/16) by Wallis Bamberg, PA-C. 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 No apparent DVT risk factors, but did have some swelling and tenderness at right calf at that visit. He had ultrasound done on 06/22/16: no DVT or SVT in right lower extremity but there was interstitial fluid throughout the calf, mostly situated in the mid calf. Advised to start anaprox for pain and inflammation, compression stockings, flexeril as needed, and here for recheck. Of note, he had normal CBC except borderline hemoglobin at last visit.   Lab Results  Component Value Date   WBC 6.0 06/22/2016   HGB 13.3 (A) 06/22/2016   HCT 39.2 (A) 06/22/2016   MCV 82.7 06/22/2016   PLT 222.0 03/07/2015    Patient states his right calf still feeling sore when there's movement, but has vastly improved. He's been taking anaprox 550mg  bid as instructed. He's also taken flexeril, which causes him to feel drowsy. He's feeling better at about 90%. He's been back to work full duty with the same hours. He denies any known injuries to the area. He denies recent antibiotic use. He denies fever, chills, light headedness, or dizziness. He denies history of anemia.   Patient Active Problem List   Diagnosis Date Noted   ROM 05/11/2008   DERMATITIS 09/04/2007   DERMATITIS, CONTACT, DUE TO PLANTS 05/20/2007   CELLULITIS/ABSCESS, SITE NEC 05/06/2007   Past Medical History:  Diagnosis Date   Bronchitis     Frequent headaches    Seizures (HCC)    As a child   Past Surgical History:  Procedure Laterality Date   EYE SURGERY Left    eyelid surgery   WISDOM TOOTH EXTRACTION     No Known Allergies Prior to Admission medications   Medication Sig Start Date End Date Taking? Authorizing Provider  cyclobenzaprine (FLEXERIL) 5 MG tablet Take 1 tablet (5 mg total) by mouth 3 (three) times daily as needed for muscle spasms. 06/22/16  Yes Wallis Bamberg, PA-C  Naftifine HCl 2 % CREA Apply daily to affected area. 04/24/15  Yes Lenn Sink, DPM  naproxen sodium (ANAPROX DS) 550 MG tablet Take 1 tablet (550 mg total) by mouth 2 (two) times daily with a meal. 06/22/16  Yes Wallis Bamberg, PA-C   Social History   Social History   Marital status: Married    Spouse name: N/A   Number of children: N/A   Years of education: N/A   Occupational History    [employer]    P/T        palate and box   Social History Main Topics   Smoking status: Never Smoker   Smokeless tobacco: Not on file   Alcohol use No   Drug use: No   Sexual activity: Yes    Partners: Female   Other Topics Concern   Not on file   Social History Narrative  Exercise-- work with [employer]   Review of Systems  Constitutional: Negative for chills, fatigue and fever.  Gastrointestinal: Negative for abdominal pain, diarrhea, nausea and vomiting.  Musculoskeletal: Positive for myalgias. Negative for arthralgias, gait problem and joint swelling.  Neurological: Negative for weakness and numbness.       Objective:   Physical Exam  Constitutional: He is oriented to person, place, and time. He appears well-developed and well-nourished. No distress.  HENT:  Head: Normocephalic and atraumatic.  Eyes: EOM are normal. Pupils are equal, round, and reactive to light.  Neck: Neck supple.  Cardiovascular: Normal rate.   Pulmonary/Chest: Effort normal. No respiratory distress.  Musculoskeletal: Normal range of motion.  Some trace lower  extremity edema, non pitting, right compared to left; non tender calf at the ankle, full ROM pain free at ankle, slight discomfort at the lower gastric, posterior knee non tender, proximal calf non tender  Neurological: He is alert and oriented to person, place, and time.  Skin: Skin is warm and dry.  Psychiatric: He has a normal mood and affect. His behavior is normal.  Nursing note and vitals reviewed.   Vitals:   06/29/16 1531  BP: 128/80  Pulse: 79  Resp: 18  Temp: 98.1 F (36.7 C)  TempSrc: Oral  SpO2: 99%  Weight: 191 lb 9.6 oz (86.9 kg)  Height: 5\' 7"  (1.702 m)      Assessment & Plan:     Manuel Foster is a 55 y.o. male Anemia, unspecified anemia type - Plan: CBC  - Repeat CBC with borderline anemia last visit. Asymptomatic.  Right calf pain  -Improving, 90% improved per patient. No DVT seen on ultrasound. He has been back to work with normal function, minimal residual symptoms.  -Can stop Flexeril at this time, decrease use of Naprosyn to only as needed, RTC precautions if pain not continuing to improve.  No orders of the defined types were placed in this encounter.  Patient Instructions    It appears you had a calf strain that should continue to improve. If you have any increased swelling, pain in the calf, or swelling or pain in other areas, return for recheck.  You can stop taking the muscle relaxant Flexeril at this time, and continue the naproxen sodium only as needed.  I also checked your blood count today as your hemoglobin or test for anemia was slightly low last time.  Return to the clinic or go to the nearest emergency room if any of your symptoms worsen or new symptoms occur.    IF you received an x-ray today, you will receive an invoice from [facility]. Please contact [facility] at [phone] with questions or concerns regarding your invoice.   IF you received labwork today, you will receive an invoice from [employer]. Please contact [facility] at [phone] with questions or concerns regarding your invoice.   Our billing staff will not be able to assist you with questions regarding bills from these companies.  You will be contacted with the lab results as soon as they are available. The fastest way to get your results is to activate your My Chart account. Instructions are located on the last page of this paperwork. If you have not heard from [place] regarding the results in 2 weeks, please contact this office.        I personally performed the services described in this documentation, which was scribed in my presence. The recorded information has been reviewed and considered,  and addended by me as needed.   Signed,   Meredith Staggers, MD [facility].  06/29/16 5:19 PM

## 2016-06-29 NOTE — Patient Instructions (Addendum)
  It appears you had a calf strain that should continue to improve. If you have any increased swelling, pain in the calf, or swelling or pain in other areas, return for recheck.  You can stop taking the muscle relaxant Flexeril at this time, and continue the naproxen sodium only as needed.  I also checked your blood count today as your hemoglobin or test for anemia was slightly low last time.  Return to the clinic or go to the nearest emergency room if any of your symptoms worsen or new symptoms occur.    IF you received an x-ray today, you will receive an invoice from [facility]. Please contact [facility] at [phone] with questions or concerns regarding your invoice.   IF you received labwork today, you will receive an invoice from [employer]. Please contact [facility] at [phone] with questions or concerns regarding your invoice.   Our billing staff will not be able to assist you with questions regarding bills from these companies.  You will be contacted with the lab results as soon as they are available. The fastest way to get your results is to activate your My Chart account. Instructions are located on the last page of this paperwork. If you have not heard from [place] regarding the results in 2 weeks, please contact this office.

## 2016-07-04 ENCOUNTER — Other Ambulatory Visit: Payer: Self-pay

## 2016-07-04 DIAGNOSIS — Z1211 Encounter for screening for malignant neoplasm of colon: Secondary | ICD-10-CM

## 2016-07-22 ENCOUNTER — Telehealth: Payer: Self-pay | Admitting: Family Medicine

## 2016-07-22 NOTE — Telephone Encounter (Signed)
Relation to ZO:XWRUpt:self Call back [phone]   Reason for call:  spouse requesting a order for colonscopy to Dr. Shirley FriarVincent C. Schooler, MD [address], [place], [place] [address] [phone]. Please advise if patient needs appointment prior to order being placed.

## 2016-07-22 NOTE — Telephone Encounter (Signed)
VM left to see if patient was having issues.    KP

## 2016-07-25 ENCOUNTER — Other Ambulatory Visit: Payer: Self-pay | Admitting: Family Medicine

## 2016-07-25 DIAGNOSIS — Z0000 Encounter for general adult medical examination without abnormal findings: Secondary | ICD-10-CM

## 2016-07-25 NOTE — Telephone Encounter (Signed)
Referral in

## 2016-07-25 NOTE — Telephone Encounter (Signed)
Pt's spouse called back in returning CMA's call.  Spouse says that a provider pt seen at [facility] advised that the pt should have a colonoscopy because he's never had one. He isn't having any active symptoms.

## 2016-07-25 NOTE — Telephone Encounter (Signed)
Please advise      KP 

## 2016-07-25 NOTE — Telephone Encounter (Signed)
Message left to call the office.    KP 

## 2016-07-25 NOTE — Telephone Encounter (Signed)
Patient wants to see Dr.Schooler. It looks like it is pending from [facility].   KP

## 2016-07-25 NOTE — Telephone Encounter (Signed)
Referral faxed to [facility], awaiting appt °

## 2016-09-28 ENCOUNTER — Other Ambulatory Visit: Payer: Self-pay | Admitting: Podiatry

## 2016-10-03 LAB — HM COLONOSCOPY

## 2016-10-22 ENCOUNTER — Encounter: Payer: Self-pay | Admitting: Family Medicine

## 2016-10-22 NOTE — Progress Notes (Signed)
Internal Hemorrhoids were found. [facility] in 10 [facility]. TL/CMA

## 2017-05-01 ENCOUNTER — Ambulatory Visit: Payer: BLUE CROSS/BLUE SHIELD | Admitting: Podiatry

## 2017-05-22 ENCOUNTER — Telehealth: Payer: Self-pay | Admitting: Family Medicine

## 2017-05-22 NOTE — Telephone Encounter (Signed)
Caller name: Alona BeneJoyce Relationship to patient: Wife Can be reached: [phone] Pharmacy:  Reason for call: Wife states patient has decreased weight within the last few months and would like to have him come in to have a prostrate exam.

## 2017-05-22 NOTE — Telephone Encounter (Signed)
We have openings today.  He can come in.  We have a 1pm and a 330pm

## 2017-05-27 NOTE — Telephone Encounter (Signed)
Patient scheduled for Thursday 6/28

## 2017-05-29 ENCOUNTER — Ambulatory Visit (INDEPENDENT_AMBULATORY_CARE_PROVIDER_SITE_OTHER): Payer: BLUE CROSS/BLUE SHIELD | Admitting: Family Medicine

## 2017-05-29 ENCOUNTER — Telehealth: Payer: Self-pay | Admitting: Family Medicine

## 2017-05-29 ENCOUNTER — Other Ambulatory Visit (HOSPITAL_COMMUNITY)
Admission: RE | Admit: 2017-05-29 | Discharge: 2017-05-29 | Disposition: A | Discharge status: Home / Self Care | Payer: BLUE CROSS/BLUE SHIELD | Source: Ambulatory Visit | Attending: Family Medicine | Admitting: Family Medicine

## 2017-05-29 ENCOUNTER — Encounter: Payer: Self-pay | Admitting: Family Medicine

## 2017-05-29 VITALS — BP 138/88 | HR 89 | Temp 98.8°F | Resp 16 | Ht 67.0 in | Wt 178.8 lb

## 2017-05-29 DIAGNOSIS — R634 Abnormal weight loss: Secondary | ICD-10-CM | POA: Diagnosis not present

## 2017-05-29 DIAGNOSIS — B359 Dermatophytosis, unspecified: Secondary | ICD-10-CM

## 2017-05-29 DIAGNOSIS — Z113 Encounter for screening for infections with a predominantly sexual mode of transmission: Secondary | ICD-10-CM

## 2017-05-29 LAB — HEPATITIS C ANTIBODY: HCV AB: NEGATIVE

## 2017-05-29 MED ORDER — NAFTIFINE HCL 2 % EX CREA: TOPICAL_CREAM | CUTANEOUS | 1 refills | Status: DC

## 2017-05-29 NOTE — Telephone Encounter (Signed)
Yes-- as long as it is not close to another 30 min appointment

## 2017-05-29 NOTE — Telephone Encounter (Signed)
Please advise 

## 2017-05-29 NOTE — Assessment & Plan Note (Signed)
Pt started a new job with [employer] not long ago and is working hard Check labs and std at pt request

## 2017-05-29 NOTE — Telephone Encounter (Signed)
Per AVS pt was advised to schedule CPE in 3 months. Pt says that he need an appt after 3. Informed pt of pcp's schedule. He would like to know if provider could complete cpe after 3?   Is it okay to put 2 slots together after 3 to complete cpe?    Please advise

## 2017-05-29 NOTE — Progress Notes (Signed)
Patient ID: Manuel Foster, male    DOB: December 24, 1960  Age: 56 y.o. MRN: [id number]    Subjective:  Subjective  HPI Manuel Foster presents for 8 lbs weight loss in last year.  He just started working for [employer] and is working hard.  Pt felt the weight loss was from that but his wife is concerned.     Review of Systems  Constitutional: Positive for unexpected weight change. Negative for fatigue.  HENT: Negative for congestion, ear pain, hearing loss, nosebleeds, postnasal drip, rhinorrhea, sinus pressure, sneezing and tinnitus.   Eyes: Negative for photophobia, discharge, itching and visual disturbance.  Respiratory: Negative.  Negative for cough and shortness of breath.   Cardiovascular: Negative.  Negative for chest pain and palpitations.  Gastrointestinal: Negative for abdominal distention, abdominal pain, anal bleeding, blood in stool and constipation.  Endocrine: Negative.   Genitourinary: Negative.   Musculoskeletal: Negative.   Skin: Negative.   Allergic/Immunologic: Negative.   Neurological: Negative for dizziness, weakness, light-headedness, numbness and headaches.  Psychiatric/Behavioral: Negative for agitation, confusion, decreased concentration, dysphoric mood, sleep disturbance and suicidal ideas. The patient is not nervous/anxious.     History Past Medical History:  Diagnosis Date  . Bronchitis   . Frequent headaches   . Seizures (HCC)    As a child    He has a past surgical history that includes Eye surgery (Left) and Wisdom tooth extraction.   His family history includes Alcohol abuse in his brother and brother; Diabetes in his father and sister; Stroke in his mother.He reports that he has never smoked. He has never used smokeless tobacco. He reports that he does not drink alcohol or use drugs.  No current outpatient prescriptions on file prior to visit.   No current facility-administered medications on file prior to visit.      Objective:  Objective    Physical Exam  Constitutional: He is oriented to person, place, and time. Vital signs are normal. He appears well-developed and well-nourished. He is sleeping.  HENT:  Head: Normocephalic and atraumatic.  Mouth/Throat: Oropharynx is clear and moist.  Eyes: EOM are normal. Pupils are equal, round, and reactive to light.  Neck: Normal range of motion. Neck supple. No thyromegaly present.  Cardiovascular: Normal rate and regular rhythm.   No murmur heard. Pulmonary/Chest: Effort normal and breath sounds normal. No respiratory distress. He has no wheezes. He has no rales. He exhibits no tenderness.  Genitourinary: Rectum normal, prostate normal and penis normal. Rectal exam shows guaiac negative stool. No penile tenderness.  Musculoskeletal: He exhibits no edema or tenderness.  Neurological: He is alert and oriented to person, place, and time.  Skin: Skin is warm and dry.  Psychiatric: He has a normal mood and affect. His behavior is normal. Judgment and thought content normal.  Nursing note and vitals reviewed.  BP 138/88   Pulse 89   Temp 98.8 F (37.1 C) (Oral)   Resp 16   Ht 5\' 7"  (1.702 m)   Wt 178 lb 12.8 oz (81.1 kg)   SpO2 98%   BMI 28.00 kg/m  Wt Readings from Last 3 Encounters:  05/29/17 178 lb 12.8 oz (81.1 kg)  06/29/16 191 lb 9.6 oz (86.9 kg)  06/22/16 189 lb 9.6 oz (86 kg)     Lab Results  Component Value Date   WBC 5.2 06/29/2016   HGB 12.7 (L) 06/29/2016   HCT 38.6 06/29/2016   PLT 242 06/29/2016   GLUCOSE 90 06/22/2016   CHOL  245 (H) 03/07/2015   TRIG 52.0 03/07/2015   HDL 59.30 03/07/2015   LDLCALC 175 (H) 03/07/2015   ALT 19 06/22/2016   AST 27 06/22/2016   NA 138 06/22/2016   K 4.1 06/22/2016   CL 105 06/22/2016   CREATININE 1.02 06/22/2016   BUN 18 06/22/2016   CO2 26 06/22/2016   TSH 1.25 03/07/2015    No results found.   Assessment & Plan:  Plan  I have discontinued Mr. Manuel Foster's naproxen sodium and cyclobenzaprine. I am also having  him maintain his Naftifine HCl.  Meds ordered this encounter  Medications  . Naftifine HCl 2 % CREA    Sig: APPLY DAILY TO AFFECTED AREA.    Dispense:  45 g    Refill:  1    Problem List Items Addressed This Visit      Unprioritized   Loss of weight - Primary    Pt started a new job with [employer] not long ago and is working hard Check labs and std at pt request      Relevant Orders   CBC with Differential/Platelet   TSH   Comprehensive metabolic panel    Other Visit Diagnoses    Screening for STD (sexually transmitted disease)       Relevant Orders   HIV antibody   Hepatitis C antibody   RPR   PSA   HSV Type I/II IgG, IgMw/ reflex   Urine cytology ancillary only   Tinea       Relevant Medications   Naftifine HCl 2 % CREA      Follow-up: Return in about 3 months (around 08/29/2017) for annual exam, fasting.  Donato SchultzYvonne R Lowne Chase, DO

## 2017-05-30 LAB — CBC WITH DIFFERENTIAL/PLATELET
BASOS ABS: 0 10*3/uL (ref 0.0–0.1)
Basophils Relative: 0.7 % (ref 0.0–3.0)
EOS PCT: 1.1 % (ref 0.0–5.0)
Eosinophils Absolute: 0.1 10*3/uL (ref 0.0–0.7)
HEMATOCRIT: 39.6 % (ref 39.0–52.0)
Hemoglobin: 13.3 g/dL (ref 13.0–17.0)
LYMPHS PCT: 17.2 % (ref 12.0–46.0)
Lymphs Abs: 1.1 10*3/uL (ref 0.7–4.0)
MCHC: 33.5 g/dL (ref 30.0–36.0)
MCV: 85 fl (ref 78.0–100.0)
MONOS PCT: 9.1 % (ref 3.0–12.0)
Monocytes Absolute: 0.6 10*3/uL (ref 0.1–1.0)
NEUTROS ABS: 4.8 10*3/uL (ref 1.4–7.7)
Neutrophils Relative %: 71.9 % (ref 43.0–77.0)
PLATELETS: 270 10*3/uL (ref 150.0–400.0)
RBC: 4.65 Mil/uL (ref 4.22–5.81)
RDW: 13.7 % (ref 11.5–15.5)
WBC: 6.7 10*3/uL (ref 4.0–10.5)

## 2017-05-30 LAB — TSH: TSH: 1.74 u[IU]/mL (ref 0.35–4.50)

## 2017-05-30 LAB — COMPREHENSIVE METABOLIC PANEL
ALBUMIN: 4.4 g/dL (ref 3.5–5.2)
ALT: 24 U/L (ref 0–53)
AST: 31 U/L (ref 0–37)
Alkaline Phosphatase: 62 U/L (ref 39–117)
BILIRUBIN TOTAL: 0.6 mg/dL (ref 0.2–1.2)
BUN: 17 mg/dL (ref 6–23)
CALCIUM: 9.7 mg/dL (ref 8.4–10.5)
CO2: 29 mEq/L (ref 19–32)
CREATININE: 1.08 mg/dL (ref 0.40–1.50)
Chloride: 104 mEq/L (ref 96–112)
GFR: 91.77 mL/min (ref >60.00)
Glucose, Bld: 78 mg/dL (ref 70–99)
Potassium: 4 mEq/L (ref 3.5–5.1)
Sodium: 141 mEq/L (ref 135–145)
Total Protein: 7.8 g/dL (ref 6.0–8.3)

## 2017-05-30 LAB — HIV ANTIBODY (ROUTINE TESTING W REFLEX): HIV: NONREACTIVE

## 2017-05-30 LAB — RPR

## 2017-05-30 LAB — PSA: PSA: 0.61 ng/mL (ref 0.10–4.00)

## 2017-06-02 LAB — URINE CYTOLOGY ANCILLARY ONLY
Chlamydia: NEGATIVE
Neisseria Gonorrhea: NEGATIVE
TRICH (WINDOWPATH): NEGATIVE

## 2017-06-03 ENCOUNTER — Telehealth: Payer: Self-pay | Admitting: Family Medicine

## 2017-06-03 LAB — HSV TYPE I/II IGG, IGMW/ REFLEX
HSV 1 Glycoprotein G Ab, IgG: 27.1 Index — ABNORMAL HIGH (ref <0.90)
HSV 1 IgM Screen: NEGATIVE
HSV 2 Glycoprotein G Ab, IgG: <0.9 Index (ref <0.90)
HSV 2 IgM Screen: NEGATIVE

## 2017-06-03 NOTE — Telephone Encounter (Signed)
Pt called in to request his lab results from his previous visit with provider (05/29/17)   He would like to have them sent via mail

## 2017-06-03 NOTE — Telephone Encounter (Signed)
Mailed results as requested.

## 2017-06-10 NOTE — Telephone Encounter (Signed)
LVM for pt to call back to schedule.

## 2017-08-21 ENCOUNTER — Other Ambulatory Visit: Payer: Self-pay | Admitting: Family Medicine

## 2017-08-21 DIAGNOSIS — J0110 Acute frontal sinusitis, unspecified: Secondary | ICD-10-CM

## 2017-08-22 NOTE — Telephone Encounter (Signed)
YL-This is not in the med list/would you want to grant a fill for this? Plz advise/thx dmf

## 2018-09-10 ENCOUNTER — Telehealth: Payer: Self-pay | Admitting: *Deleted

## 2018-09-10 DIAGNOSIS — Z8269 Family history of other diseases of the musculoskeletal system and connective tissue: Secondary | ICD-10-CM

## 2018-09-10 DIAGNOSIS — Z820 Family history of epilepsy and other diseases of the nervous system: Secondary | ICD-10-CM

## 2018-09-10 NOTE — Telephone Encounter (Signed)
Please advise 

## 2018-09-10 NOTE — Telephone Encounter (Signed)
Copied from CRM [id number]. 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

## 2018-09-11 NOTE — Telephone Encounter (Signed)
Does he have and symptoms?   We can refer to neuro

## 2018-09-11 NOTE — Telephone Encounter (Signed)
Author phoned pt.'s wife back to update on neuro referral per Dr. Laury Axon. Wife verbalized understanding.

## 2018-09-11 NOTE — Telephone Encounter (Signed)
Pt calling to check status. Pt wife seems concerned. Pt wife states a sibling passed away last 24-Apr-2023 from this. Out of 11 siblings 7 have passed. 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 ZO#[phone]

## 2019-05-24 ENCOUNTER — Telehealth: Payer: Self-pay

## 2019-05-24 DIAGNOSIS — Z20822 Contact with and (suspected) exposure to covid-19: Secondary | ICD-10-CM

## 2019-05-24 NOTE — Telephone Encounter (Signed)
Needs Covid testing per Dr. Etter Sjogren due to possible exposure

## 2019-05-24 NOTE — Telephone Encounter (Signed)
Called patient, scheduled him for COVID 19 test tomorrow morning at 8:45 am at [facility]  Patient advised to remain in car and wear a mask at testing site.

## 2019-05-25 ENCOUNTER — Other Ambulatory Visit: Payer: BLUE CROSS/BLUE SHIELD

## 2019-05-25 DIAGNOSIS — Z20822 Contact with and (suspected) exposure to covid-19: Secondary | ICD-10-CM

## 2019-05-26 ENCOUNTER — Encounter: Payer: Self-pay | Admitting: Family Medicine

## 2019-05-26 ENCOUNTER — Ambulatory Visit (INDEPENDENT_AMBULATORY_CARE_PROVIDER_SITE_OTHER): Payer: BC Managed Care – PPO | Admitting: Family Medicine

## 2019-05-26 ENCOUNTER — Other Ambulatory Visit: Payer: Self-pay

## 2019-05-26 DIAGNOSIS — J40 Bronchitis, not specified as acute or chronic: Secondary | ICD-10-CM | POA: Diagnosis not present

## 2019-05-26 MED ORDER — AZITHROMYCIN 250 MG PO TABS: ORAL_TABLET | ORAL | 0 refills | Status: DC

## 2019-05-26 MED ORDER — PROMETHAZINE-DM 6.25-15 MG/5ML PO SYRP: 5.0000 mL | ORAL_SOLUTION | Freq: Four times a day (QID) | ORAL | 0 refills | Status: DC | PRN

## 2019-05-26 NOTE — Progress Notes (Signed)
Virtual Visit via Video Note  I connected with Manuel Foster on 05/26/19 at 10:30 AM EDT by a video enabled telemedicine application and verified that I am speaking with the correct person using two identifiers.  Location: Patient: home with wife Provider: home    I discussed the limitations of evaluation and management by telemedicine and the availability of in person appointments. The patient expressed understanding and agreed to proceed.  History of Present Illness: Pt is home coughing x 1 week-- dry  Sore throat and chest sore from coughing Some runny nose  Taking tylenol   covid test is pending    Observations/Objective: 99.5 temp with tylenol     100 before tylenol Pt in NAD   Assessment and Plan:. 1. Bronchitis covid testing pending  abx and cough med per orders Quarantine at home Call or rto prn  - promethazine-dextromethorphan (PROMETHAZINE-DM) 6.25-15 MG/5ML syrup; Take 5 mLs by mouth 4 (four) times daily as needed.  Dispense: 118 mL; Refill: 0 - azithromycin (ZITHROMAX Z-PAK) 250 MG tablet; As directed  Dispense: 6 each; Refill: 0   Follow Up Instructions:    I discussed the assessment and treatment plan with the patient. The patient was provided an opportunity to ask questions and all were answered. The patient agreed with the plan and demonstrated an understanding of the instructions.   The patient was advised to call back or seek an in-person evaluation if the symptoms worsen or if the condition fails to improve as anticipated.  I provided 15 minutes of non-face-to-face time during this encounter.   Ann Held, DO

## 2019-05-27 ENCOUNTER — Ambulatory Visit: Payer: BC Managed Care – PPO | Admitting: Podiatry

## 2019-05-28 ENCOUNTER — Telehealth: Payer: Self-pay | Admitting: *Deleted

## 2019-05-28 NOTE — Telephone Encounter (Signed)
Pt's wife Blanch Media called back to see if results for test are available; explained that they are not; she would like a call back when they are because she can not see the pt's my chart.

## 2019-05-28 NOTE — Telephone Encounter (Signed)
Will need virtual visit only. COVID 19 positive.  

## 2019-05-28 NOTE — Telephone Encounter (Signed)
Pt's wife Blanch Media called to get pt's COVID results; explained that results take 72 hours or 3 business days; also explained that this can be delayed based on the number of tests that have to be run; she verbalized understanding; she would like to schedule a follow up appointment with Dr Etter Sjogren; for the pt since she tested positive; [facility] not available by phone to schedule appoint or be given lab results; will route to office for notification; the pt can be contacted at [phone]

## 2019-05-29 LAB — NOVEL CORONAVIRUS, NAA: SARS-CoV-2, NAA: DETECTED — AB

## 2019-05-31 ENCOUNTER — Encounter: Payer: Self-pay | Admitting: Family Medicine

## 2019-05-31 ENCOUNTER — Ambulatory Visit (INDEPENDENT_AMBULATORY_CARE_PROVIDER_SITE_OTHER): Payer: BC Managed Care – PPO | Admitting: Family Medicine

## 2019-05-31 ENCOUNTER — Other Ambulatory Visit: Payer: Self-pay

## 2019-05-31 VITALS — Temp 99.9°F | Ht 67.0 in

## 2019-05-31 DIAGNOSIS — U071 COVID-19: Secondary | ICD-10-CM | POA: Diagnosis not present

## 2019-05-31 DIAGNOSIS — J40 Bronchitis, not specified as acute or chronic: Secondary | ICD-10-CM

## 2019-05-31 MED ORDER — LEVOFLOXACIN 500 MG PO TABS: 500.0000 mg | ORAL_TABLET | Freq: Every day | ORAL | 0 refills | Status: DC

## 2019-05-31 MED ORDER — PROMETHAZINE-DM 6.25-15 MG/5ML PO SYRP: 5.0000 mL | ORAL_SOLUTION | Freq: Four times a day (QID) | ORAL | 0 refills | Status: DC | PRN

## 2019-05-31 NOTE — Progress Notes (Signed)
Virtual Visit via Video Note  I connected with Manuel Foster on 05/31/19 at 10:15 AM EDT by a video enabled telemedicine application and verified that I am speaking with the correct person using two identifiers.  Location: Patient: home with wife and son Provider: home    I discussed the limitations of evaluation and management by telemedicine and the availability of in person appointments. The patient expressed understanding and agreed to proceed.  History of Present Illness: Pt is home --- tested + for covid over the weekend  Pt still c/o cough / productive.  Takes last of the z pack today     No more fevers  Observations/Objective: Afebrile  No other vitals obtained   Assessment and Plan: 1. COVID-19 virus detected  - MyChart COVID-19 home monitoring program; Future - Temperature monitoring; Future  2. Bronchitis No better  abx and cough med sent to pharmacy If symptoms do not completely resolve pt may need to be retested  - levofloxacin (LEVAQUIN) 500 MG tablet; Take 1 tablet (500 mg total) by mouth daily.  Dispense: 7 tablet; Refill: 0 - promethazine-dextromethorphan (PROMETHAZINE-DM) 6.25-15 MG/5ML syrup; Take 5 mLs by mouth 4 (four) times daily as needed.  Dispense: 118 mL; Refill: 0  Follow Up Instructions:    I discussed the assessment and treatment plan with the patient. The patient was provided an opportunity to ask questions and all were answered. The patient agreed with the plan and demonstrated an understanding of the instructions.   The patient was advised to call back or seek an in-person evaluation if the symptoms worsen or if the condition fails to improve as anticipated.  I provided 15 minutes of non-face-to-face time during this encounter.   Ann Held, DO

## 2019-06-03 MED ORDER — BENZONATATE 200 MG PO CAPS: 200.0000 mg | ORAL_CAPSULE | Freq: Three times a day (TID) | ORAL | 0 refills | Status: DC | PRN

## 2019-06-03 NOTE — Telephone Encounter (Signed)
Spoke w/ Blanch Media- informed of PCP recommendations. Rx sent to [facility]. Blanch Media verbalized understanding.

## 2019-06-03 NOTE — Telephone Encounter (Signed)
Please advise 

## 2019-06-03 NOTE — Telephone Encounter (Signed)
Pt can take otc antihistamine (zyrtec , xyzal, or claritin )   Please send tessalon perles 200 mg 1 po tid prn cough #30

## 2019-06-03 NOTE — Telephone Encounter (Signed)
Patient's spouse, Blanch Media, stated that the last prescription that was provided levofloxacin (LEVAQUIN) 500 MG tablet    is not helping with his cough.  He also feels a tickling in his throat.  Please advise.

## 2019-06-03 NOTE — Addendum Note (Signed)
Addended byDamita Dunnings D on: 06/03/2019 01:27 PM   Modules accepted: Orders

## 2019-06-15 ENCOUNTER — Telehealth: Payer: Self-pay | Admitting: Family Medicine

## 2019-06-15 DIAGNOSIS — Z20822 Contact with and (suspected) exposure to covid-19: Secondary | ICD-10-CM

## 2019-06-15 NOTE — Telephone Encounter (Signed)
Relation to pt: self  Call back number: [phone]    Reason for call:  Spouse states patient employer is requesting patient to re take the COVID test and employer is requesting the actually lab results/report. Please advise spouse when orders are placed best # [phone]

## 2019-06-15 NOTE — Telephone Encounter (Signed)
Contacted pt, Pt verified that Manuel Foster is the closet site to him. Informed pt I will place the order but he will no longer require an appointment. Pt is aware that site tests between 8am-3:45 M-F. This is a repeat test so pt is aware to remain in car and wear a mask. Order was placed Nothing further is needed

## 2019-06-15 NOTE — Telephone Encounter (Signed)
Pt was + covid---  Employer requiring repeat test

## 2019-06-15 NOTE — Telephone Encounter (Signed)
I sent request to [facility]

## 2019-06-15 NOTE — Telephone Encounter (Signed)
Please advise 

## 2019-06-16 ENCOUNTER — Other Ambulatory Visit: Payer: Self-pay

## 2019-06-16 DIAGNOSIS — Z0279 Encounter for issue of other medical certificate: Secondary | ICD-10-CM

## 2019-06-16 DIAGNOSIS — Z20822 Contact with and (suspected) exposure to covid-19: Secondary | ICD-10-CM

## 2019-06-17 ENCOUNTER — Ambulatory Visit: Payer: BC Managed Care – PPO | Admitting: Podiatry

## 2019-06-21 ENCOUNTER — Telehealth: Payer: Self-pay

## 2019-06-21 LAB — NOVEL CORONAVIRUS, NAA: SARS-CoV-2, NAA: NOT DETECTED

## 2019-06-21 NOTE — Telephone Encounter (Signed)
Per pt. Request, mailed recent COVID results to home address.  

## 2019-06-23 ENCOUNTER — Telehealth: Payer: Self-pay | Admitting: Family Medicine

## 2019-06-23 NOTE — Telephone Encounter (Signed)
Patient came into the office to drop off forms to return to work to be completed by Dr. Etter Sjogren. Placed in provider tray. Please call when ready for pick up faxed.

## 2019-06-25 ENCOUNTER — Telehealth: Payer: Self-pay

## 2019-06-25 DIAGNOSIS — Z0279 Encounter for issue of other medical certificate: Secondary | ICD-10-CM

## 2019-06-25 NOTE — Telephone Encounter (Signed)
Forms faxed and received confirmation. Original on my desk, two copies made. Placed in scan and billing.

## 2019-07-20 ENCOUNTER — Encounter: Payer: Self-pay | Admitting: Family Medicine

## 2019-07-21 ENCOUNTER — Encounter: Payer: Self-pay | Admitting: Family Medicine

## 2019-07-21 NOTE — Telephone Encounter (Signed)
Note faxed.

## 2019-07-21 NOTE — Telephone Encounter (Signed)
Note done--- can be faxed

## 2019-07-22 ENCOUNTER — Encounter: Payer: Self-pay | Admitting: Family Medicine

## 2019-07-22 NOTE — Telephone Encounter (Signed)
Nira Conn -- there are 2 letters in [place] dated yesterday. One contains the information the pt is requesting in this mychart message. Can you verify the correct one was faxed with the information he is requesting above?

## 2019-07-22 NOTE — Telephone Encounter (Signed)
It is in my chart

## 2019-10-07 ENCOUNTER — Ambulatory Visit (INDEPENDENT_AMBULATORY_CARE_PROVIDER_SITE_OTHER): Payer: BC Managed Care – PPO | Admitting: Podiatry

## 2019-10-07 ENCOUNTER — Other Ambulatory Visit: Payer: Self-pay

## 2019-10-07 ENCOUNTER — Encounter: Payer: Self-pay | Admitting: Podiatry

## 2019-10-07 DIAGNOSIS — B369 Superficial mycosis, unspecified: Secondary | ICD-10-CM | POA: Diagnosis not present

## 2019-10-07 NOTE — Progress Notes (Signed)
This patient presents the office stating that he is having itching and irritation between multiple toes on both feet.  He says the toes have been causing pain and discomfort for several weeks.  He has applied medicine to this area but the problem persists.  He presents the office today for an evaluation and treatment of his skin rash between the toes on both feet.  Vascular  Dorsalis pedis and posterior tibial pulses are palpable  B/L.  Capillary return  WNL.  Temperature gradient is  WNL.  Skin turgor  WNL  Sensorium  Senn Weinstein monofilament wire  WNL. Normal tactile sensation.  Nail Exam  Patient has normal nails with no evidence of bacterial or fungal infection.  Orthopedic  Exam  Muscle tone and muscle strength  WNL.  No limitations of motion feet  B/L.  No crepitus or joint effusion noted.  Foot type is unremarkable and digits show no abnormalities. HAV  B/L.  Skin  No open lesions.  Normal skin texture and turgor.  Maceration and peeling noted between the third and fourth interspaces right foot.  Masceration and peeling 4th interspace left foot.    Interdigital fungal infection with associated masceration.    IE.  Discussed this condition with this patient.  Told him he needs to keep his interdigital areas dry.  Therefore he was told to use powders change her socks and use a hair dryer to dry the areas between his toes.  He was also told to pick up Lamisil athlete's foot for application to the skin between the digits.  Patient also had gentian violet applied to the interdigital spaces both feet.  Return to clinic as needed  Gardiner Barefoot DPM

## 2020-02-14 ENCOUNTER — Other Ambulatory Visit: Payer: Self-pay

## 2020-02-14 ENCOUNTER — Other Ambulatory Visit: Payer: Self-pay | Admitting: Family Medicine

## 2020-02-14 ENCOUNTER — Encounter: Payer: Self-pay | Admitting: Family Medicine

## 2020-02-14 ENCOUNTER — Ambulatory Visit: Payer: BC Managed Care – PPO | Admitting: Family Medicine

## 2020-02-14 ENCOUNTER — Ambulatory Visit (HOSPITAL_BASED_OUTPATIENT_CLINIC_OR_DEPARTMENT_OTHER)
Admission: RE | Admit: 2020-02-14 | Discharge: 2020-02-14 | Disposition: A | Discharge status: Home / Self Care | Payer: BC Managed Care – PPO | Source: Ambulatory Visit | Attending: Family Medicine | Admitting: Family Medicine

## 2020-02-14 VITALS — BP 130/78 | HR 68 | Temp 97.6°F | Resp 18 | Ht 67.0 in | Wt 201.6 lb

## 2020-02-14 DIAGNOSIS — S6990XA Unspecified injury of unspecified wrist, hand and finger(s), initial encounter: Secondary | ICD-10-CM

## 2020-02-14 DIAGNOSIS — M79645 Pain in left finger(s): Secondary | ICD-10-CM | POA: Diagnosis not present

## 2020-02-14 NOTE — Progress Notes (Addendum)
Patient ID: Manuel Foster, male    DOB: [birth year]  Age: 59 y.o. MRN: [id number]    Subjective:  Subjective  HPI Manuel Foster presents for pain in L pinky x 3 weeks.   A box at work came down slide and jammed it.  Aching since.   Review of Systems  Constitutional: Negative for appetite change, diaphoresis, fatigue and unexpected weight change.  Eyes: Negative for pain, redness and visual disturbance.  Respiratory: Negative for cough, chest tightness, shortness of breath and wheezing.   Cardiovascular: Negative for chest pain, palpitations and leg swelling.  Endocrine: Negative for cold intolerance, heat intolerance, polydipsia, polyphagia and polyuria.  Genitourinary: Negative for difficulty urinating, dysuria and frequency.  Musculoskeletal: Positive for joint swelling.  Neurological: Negative for dizziness, light-headedness, numbness and headaches.    History Past Medical History:  Diagnosis Date  . Bronchitis   . Frequent headaches   . Seizures (HCC)    As a child    He has a past surgical history that includes Eye surgery (Left) and Wisdom tooth extraction.   His family history includes Alcohol abuse in his brother and brother; Diabetes in his father and sister; Stroke in his mother.He reports that he has never smoked. He has never used smokeless tobacco. He reports that he does not drink alcohol or use drugs.  Current Outpatient Medications on File Prior to Visit  Medication Sig Dispense Refill  . Naftifine HCl 2 % CREA APPLY DAILY TO AFFECTED AREA. 45 g 1  . fluticasone (FLONASE) 50 MCG/ACT nasal spray PLACE 2 SPRAYS INTO BOTH NOSTRILS DAILY. (Patient not taking: Reported on 02/14/2020) 16 g 5   No current facility-administered medications on file prior to visit.     Objective:  Objective  Physical Exam Vitals and nursing note reviewed.  Musculoskeletal:     Left hand: Swelling and tenderness present. Decreased range of motion.       Arms:    BP 130/78  (BP Location: Right Arm, Patient Position: Sitting, Cuff Size: Large)   Pulse 68   Temp 97.6 F (36.4 C) (Temporal)   Resp 18   Ht 5\' 7"  (1.702 m)   Wt 201 lb 9.6 oz (91.4 kg)   SpO2 97%   BMI 31.58 kg/m  Wt Readings from Last 3 Encounters:  02/14/20 201 lb 9.6 oz (91.4 kg)  05/29/17 178 lb 12.8 oz (81.1 kg)  06/29/16 191 lb 9.6 oz (86.9 kg)     Lab Results  Component Value Date   WBC 6.7 05/29/2017   HGB 13.3 05/29/2017   HCT 39.6 05/29/2017   PLT 270.0 05/29/2017   GLUCOSE 78 05/29/2017   CHOL 245 (H) 03/07/2015   TRIG 52.0 03/07/2015   HDL 59.30 03/07/2015   LDLCALC 175 (H) 03/07/2015   ALT 24 05/29/2017   AST 31 05/29/2017   NA 141 05/29/2017   K 4.0 05/29/2017   CL 104 05/29/2017   CREATININE 1.08 05/29/2017   BUN 17 05/29/2017   CO2 29 05/29/2017   TSH 1.74 05/29/2017   PSA 0.61 05/29/2017    No results found.   Assessment & Plan:  Plan  I have discontinued Manuel Foster. Manuel Foster promethazine-dextromethorphan, azithromycin, levofloxacin, promethazine-dextromethorphan, and benzonatate. I am also having him maintain his Naftifine HCl and fluticasone.  No orders of the defined types were placed in this encounter.   Problem List Items Addressed This Visit    None    Visit Diagnoses    Pain in finger of  left hand    -  Primary   Relevant Orders   DG Hand Complete Left (Completed)     finger splint  Xray  Refer to ortho  Follow-up: Return if symptoms worsen or fail to improve.  Ann Held, DO

## 2020-02-14 NOTE — Patient Instructions (Signed)
Jammed Finger A jammed finger is an injury to the ligaments that support your finger bones. Ligaments are bands of tissue that connect bones to each other. This injury happens when the ligaments are stretched beyond their normal range of motion (sprained). Symptoms may include:  Pain.  Swelling.  Discoloration and bruising around the joint.  Difficulty bending or straightening (extending) the finger.  Not being able to use the finger normally. Treatment may include:  Wearing a splint to keep the finger in place while it heals.  Taping the injured finger to the fingers beside it (buddy taping) to keep it in place.  Pain medicines.  Physical therapy to help you regain finger strength and range of motion. Follow these instructions at home: If you have a splint or buddy taping:   Wear the splint or tape as told by your health care provider. Remove the splint or tape only as told by your health care provider.  Loosen the splint or tape if your finger tingles, becomes numb, or turns cold and blue.  Cover the splint or tape with a watertight covering when you take a bath or a shower.  If you have buddy taping, ask your health care provider if it needs to be adjusted or removed and redone periodically. Managing pain, stiffness, and swelling  If directed, put ice on the injured finger: ? Put ice in a plastic bag. ? Place a towel between your skin and the bag. ? Leave the ice on for 20 minutes, 2-3 times a day.  Raise (elevate) the injured finger above the level of your heart while you are sitting or lying down. General instructions  Take over-the-counter and prescription medicines only as told by your health care provider.  Rest your finger until your health care provider says you can move it again. Your finger may feel stiff and painful for a while.  Do physical therapy exercises as directed. It may help to start doing these exercises with your hand in a bowl of warm  water.  Keep all follow-up visits as told by your health care provider. This is important. Contact a health care provider if:  You have pain or swelling that gets worse or does not get better with medicine.  You have been treated for your jammed finger but you still cannot extend the finger.  You have a fever. Get help right away if:  Even after loosening your splint or tape, your finger: ? Is very red and swollen. ? Is white or blue. ? Feels tingly or becomes numb. Summary  A jammed finger is an injury to the ligaments that support the finger bones.  This injury happens when the ligaments are stretched beyond their normal range of motion (sprained).  Treatment may include a splint or buddy taping. This information is not intended to replace advice given to you by your health care provider. Make sure you discuss any questions you have with your health care provider. Document Revised: 03/12/2019 Document Reviewed: 09/02/2017 Elsevier Patient Education  2020 [employer].

## 2020-02-16 ENCOUNTER — Other Ambulatory Visit: Payer: Self-pay

## 2020-02-16 ENCOUNTER — Encounter: Payer: Self-pay | Admitting: Orthopaedic Surgery

## 2020-02-16 ENCOUNTER — Ambulatory Visit: Payer: BC Managed Care – PPO | Admitting: Orthopaedic Surgery

## 2020-02-16 DIAGNOSIS — S63617A Unspecified sprain of left little finger, initial encounter: Secondary | ICD-10-CM

## 2020-02-16 NOTE — Progress Notes (Signed)
Office Visit Note   Patient: Manuel Foster           Date of Birth: 07/21/1961           MRN: [id number] Visit Date: 02/16/2020              Requested by: [address], [place], [place] [address] [place],   [address] PCP: Carollee Herter, Alferd Apa, DO   Assessment & Plan: Visit Diagnoses:  1. Sprain of left little finger, unspecified site of digit, initial encounter     Plan: We placed a new splint on his left finger and this is mostly just to protect it while he is at work.  Encouraged him to take this for now daily moving the finger.  Also apply Voltaren gel into the finger 3-4 times daily.  He can pick this up at his local pharmacy.  Follow-up with [place] if condition becomes worse or he has any other concerns. Follow-Up Instructions: No follow-ups on file.   Orders:  No orders of the defined types were placed in this encounter.  No orders of the defined types were placed in this encounter.     Procedures: No procedures performed   Clinical Data: No additional findings.   Subjective: Chief Complaint  Patient presents with  . Left Little Finger - Pain, Injury    HPI [name] is a 59 year old male were seen for the first time for left fifth finger injury.  He states that he injured it on 02/14/2020 while at work he jammed it into a box.  He has had swelling pain in the finger since that time.  He did have radiographs of the left hand performed on 02/14/2020 which I personally reviewed.  3 views of the left hand show no acute fractures particularly involving the left fifth phalanx.  Soft tissue swelling is seen around the fifth phalanx.  No dislocation subluxation.  No significant arthritic changes.  Review of Systems See HPI otherwise negative or noncontributory.  Objective: Vital Signs: There were no vitals taken for this visit.  Physical Exam Constitutional:      Appearance: He is normal weight. He is not ill-appearing or diaphoretic.    Pulmonary:     Effort: Pulmonary effort is normal.  Neurological:     Mental Status: He is alert and oriented to person, place, and time.  Psychiatric:        Mood and Affect: Mood normal.     Ortho Exam Left hand he has full range of motion of the left hand.  There is no obvious deformity of the left hand particularly involving the left fifth finger.  There is soreness at the PIP joint.  There is no evidence of mallet finger or trigger finger.  He does have a mallet deformity of the right left fifth finger but no soreness.  Radial pulses 2+.  There is no rashes skin lesions ulcerations erythema or ecchymosis of either hand. Specialty Comments:  No specialty comments available.  Imaging: No results found.   PMFS History: Patient Active Problem List   Diagnosis Date Noted  . Fungal infection of skin 10/07/2019  . COVID-19 virus detected 05/31/2019  . Loss of weight 05/29/2017  . ROM 05/11/2008  . DERMATITIS 09/04/2007  . DERMATITIS, CONTACT, DUE TO PLANTS 05/20/2007  . CELLULITIS/ABSCESS, SITE NEC 05/06/2007   Past Medical History:  Diagnosis Date  . Bronchitis   . Frequent headaches   . Seizures ([place])  As a child    Family History  Problem Relation Age of Onset  . Stroke Mother   . Diabetes Father   . Alcohol abuse Brother   . Diabetes Sister   . Alcohol abuse Brother     Past Surgical History:  Procedure Laterality Date  . EYE SURGERY Left    eyelid surgery  . WISDOM TOOTH EXTRACTION     Social History   Occupational History    Employer: [employer]    Comment: P/T    Comment: palate and box  Tobacco Use  . Smoking status: Never Smoker  . Smokeless tobacco: Never Used  Substance and Sexual Activity  . Alcohol use: No    Alcohol/week: 0.0 standard drinks  . Drug use: No  . Sexual activity: Yes    Partners: Female

## 2020-11-29 NOTE — Progress Notes (Signed)
[facility] [address] [place], [place] [address] [phone]  Date:  11/30/2020   Name:  Manuel Foster   DOB:  1961/03/22   MRN:  [id number]  PCP:  [name], DO    Chief Complaint: No chief complaint on file.   History of Present Illness:  Manuel Foster is a 59 y.o. very pleasant male patient who presents with the following:  Virtual visit today for illness- pt of Dr Laury Axon I have not seen him in the past  Pt location is home, I am at office Pt ID confirmed with 2 factors, he gives consent for virtual visit today The pt, myself and his wife are on the call today   covid 19 series:done, he is due for booster but not done yet   Pt notes he got sick on Sunday- today is Thursday  He had his covid vaccine series and he had covid 19 last year  He has a cough  He has history of bronchitis  The cough is dry No fever noted  He does have a ST He notes wheezing when he is supine  No SOB No vomiting or diarrhea His wife is  He tried some OTC cough medication but did not work that well for him  Patient Active Problem List   Diagnosis Date Noted  . Fungal infection of skin 10/07/2019  . COVID-19 virus detected 05/31/2019  . Loss of weight 05/29/2017  . ROM 05/11/2008  . DERMATITIS 09/04/2007  . DERMATITIS, CONTACT, DUE TO PLANTS 05/20/2007  . CELLULITIS/ABSCESS, SITE NEC 05/06/2007    Past Medical History:  Diagnosis Date  . Bronchitis   . Frequent headaches   . Seizures (HCC)    As a child    Past Surgical History:  Procedure Laterality Date  . EYE SURGERY Left    eyelid surgery  . WISDOM TOOTH EXTRACTION      Social History   Tobacco Use  . Smoking status: Never Smoker  . Smokeless tobacco: Never Used  Substance Use Topics  . Alcohol use: No    Alcohol/week: 0.0 standard drinks  . Drug use: No    Family History  Problem Relation Age of Onset  . Stroke Mother   .  Diabetes Father   . Alcohol abuse Brother   . Diabetes Sister   . Alcohol abuse Brother     No Known Allergies  Medication list has been reviewed and updated.  Current Outpatient Medications on File Prior to Visit  Medication Sig Dispense Refill  . fluticasone (FLONASE) 50 MCG/ACT nasal spray PLACE 2 SPRAYS INTO BOTH NOSTRILS DAILY. (Patient not taking: Reported on 02/14/2020) 16 g 5  . Naftifine HCl 2 % CREA APPLY DAILY TO AFFECTED AREA. 45 g 1   No current facility-administered medications on file prior to visit.    Review of Systems:  As per HPI- otherwise negative.   Physical Examination: There were no vitals filed for this visit. There were no vitals filed for this visit. There is no height or weight on file to calculate BMI. Ideal Body Weight:     Spoke with pt on the phone.  He does not sound in any distress.  No cough noted He is not checking VS  Assessment and Plan: Bronchitis - Plan: doxycycline (VIBRA-TABS) 100 MG tablet, albuterol (VENTOLIN HFA) 108 (90 Base) MCG/ACT inhaler  Phone only visit today with pt - concern of  possible bronchitis I did encourage his to be tested for covid 19 asap as this is a distinct possibility  rx for doxycycline and albuterol as pt tends to wheeze when he gets ill He will seek care or contact me if getting worse/ if any distress Spoke with pt for 8 minutes today   Signed Abbe Amsterdam, MD

## 2020-11-30 ENCOUNTER — Telehealth (INDEPENDENT_AMBULATORY_CARE_PROVIDER_SITE_OTHER): Payer: BC Managed Care – PPO | Admitting: Family Medicine

## 2020-11-30 ENCOUNTER — Other Ambulatory Visit: Payer: Self-pay

## 2020-11-30 DIAGNOSIS — J40 Bronchitis, not specified as acute or chronic: Secondary | ICD-10-CM | POA: Diagnosis not present

## 2020-11-30 MED ORDER — DOXYCYCLINE HYCLATE 100 MG PO TABS: 100.0000 mg | ORAL_TABLET | Freq: Two times a day (BID) | ORAL | 0 refills | Status: DC

## 2020-11-30 MED ORDER — ALBUTEROL SULFATE HFA 108 (90 BASE) MCG/ACT IN AERS: 2.0000 | INHALATION_SPRAY | Freq: Four times a day (QID) | RESPIRATORY_TRACT | 0 refills | Status: DC | PRN

## 2021-06-28 ENCOUNTER — Telehealth: Payer: BC Managed Care – PPO | Admitting: Physician Assistant

## 2021-06-28 DIAGNOSIS — B009 Herpesviral infection, unspecified: Secondary | ICD-10-CM | POA: Diagnosis not present

## 2021-06-28 MED ORDER — VALACYCLOVIR HCL 1 G PO TABS: 2000.0000 mg | ORAL_TABLET | Freq: Two times a day (BID) | ORAL | 0 refills | Status: AC

## 2021-06-28 NOTE — Patient Instructions (Signed)
  [name], thank you for joining Piedad Climes, PA-C for today's virtual visit.  While this provider is not your primary care provider (PCP), if your PCP is located in our provider database this encounter information will be shared with them immediately following your visit.  Consent: (Patient) Manuel Foster provided verbal consent for this virtual visit at the beginning of the encounter.  Current Medications:  Current Outpatient Medications:    albuterol (VENTOLIN HFA) 108 (90 Base) MCG/ACT inhaler, Inhale 2 puffs into the lungs every 6 (six) hours as needed for wheezing or shortness of breath., Disp: 1 each, Rfl: 0   doxycycline (VIBRA-TABS) 100 MG tablet, Take 1 tablet (100 mg total) by mouth 2 (two) times daily., Disp: 20 tablet, Rfl: 0   fluticasone (FLONASE) 50 MCG/ACT nasal spray, PLACE 2 SPRAYS INTO BOTH NOSTRILS DAILY. (Patient not taking: Reported on 02/14/2020), Disp: 16 g, Rfl: 5   Naftifine HCl 2 % CREA, APPLY DAILY TO AFFECTED AREA., Disp: 45 g, Rfl: 1   Medications ordered in this encounter:  No orders of the defined types were placed in this encounter.    *If you need refills on other medications prior to your next appointment, please contact your pharmacy*  Follow-Up: Call back or seek an in-person evaluation if the symptoms worsen or if the condition fails to improve as anticipated.  Other Instructions Keep skin clean and dry. Avoid touching the areas. Take the Valtrex as directed. If anything worsens or you note any pain underneath or within the eye, eye redness or any change to vision, you need Er evaluation ASAP. Do not delay care.    If you have been instructed to have an in-person evaluation today at a local Urgent Care facility, please use the link below. It will take you to a list of all of our available [facility], including address, phone number and hours of operation. Please do not delay care.  [facility]  If you  or a family member do not have a primary care provider, use the link below to schedule a visit and establish care. When you choose a [facility] primary care physician or advanced practice provider, you gain a long-term partner in health. Find a Primary Care Provider  Learn more about [facility]'s in-office and virtual care options: [facility] - Get Care Now

## 2021-06-28 NOTE — Progress Notes (Signed)
Virtual Visit Consent   Manuel Foster, you are scheduled for a virtual visit with a [facility] provider today.     Just as with appointments in the office, your consent must be obtained to participate.  Your consent will be active for this visit and any virtual visit you may have with one of our providers in the next 365 days.     If you have a MyChart account, a copy of this consent can be sent to you electronically.  All virtual visits are billed to your insurance company just like a traditional visit in the office.    As this is a virtual visit, video technology does not allow for your provider to perform a traditional examination.  This may limit your provider's ability to fully assess your condition.  If your provider identifies any concerns that need to be evaluated in person or the need to arrange testing (such as labs, EKG, etc.), we will make arrangements to do so.     Although advances in technology are sophisticated, we cannot ensure that it will always work on either your end or our end.  If the connection with a video visit is poor, the visit may have to be switched to a telephone visit.  With either a video or telephone visit, we are not always able to ensure that we have a secure connection.     I need to obtain your verbal consent now.   Are you willing to proceed with your visit today?    Manuel Foster has provided verbal consent on 06/28/2021 for a virtual visit (video or telephone).   Piedad Climes, [place]   Date: 06/28/2021 1:27 PM   Virtual Visit via Video Note   I, Piedad Climes, connected with  Manuel Foster  ([id number], [birth year]) on 06/28/21 at  1:15 PM EDT by a video-enabled telemedicine application and verified that I am speaking with the correct person using two identifiers.  Location: Patient: Virtual Visit Location Patient: Home Provider: Virtual Visit Location Provider: Home Office   I discussed the limitations of evaluation and  management by telemedicine and the availability of in person appointments. The patient expressed understanding and agreed to proceed.    History of Present Illness: Manuel Foster is a 60 y.o. who identifies as a male who was assigned male at birth, and is being seen today for fever blister of L upper lip starting yesterday evening. Has history of HSV I and fever blisters. Wife notes he also has an area of R upper cheek with similar lesion starting at the same time. Has had HSV outbreaks adjacent to nose before but never this far up. Denies fever, chills, Mild tenderness but no itching or burning.  Denies vision change or eye pain of R eye or face.  HPI: HPI  Problems:  Patient Active Problem List   Diagnosis Date Noted   Fungal infection of skin 10/07/2019   COVID-19 virus detected 05/31/2019   Loss of weight 05/29/2017   ROM 05/11/2008   DERMATITIS 09/04/2007   DERMATITIS, CONTACT, DUE TO PLANTS 05/20/2007   CELLULITIS/ABSCESS, SITE NEC 05/06/2007    Allergies: No Known Allergies Medications:  Current Outpatient Medications:    valACYclovir (VALTREX) 1000 MG tablet, Take 2 tablets (2,000 mg total) by mouth 2 (two) times daily for 1 day., Disp: 4 tablet, Rfl: 0   albuterol (VENTOLIN HFA) 108 (90 Base) MCG/ACT inhaler, Inhale 2 puffs into the lungs every 6 (six) hours as  needed for wheezing or shortness of breath., Disp: 1 each, Rfl: 0   Naftifine HCl 2 % CREA, APPLY DAILY TO AFFECTED AREA., Disp: 45 g, Rfl: 1  Observations/Objective: Patient is well-developed, well-nourished in no acute distress.  Resting comfortably at home.  Head is normocephalic, atraumatic.  No labored breathing. Speech is clear and coherent with logical content.  Patient is alert and oriented at baseline.  Vesicular lesion noted of L upper lip, < 1 cm in diameter. Similar area of erythema with vesicular lesion noted of R cheek. Conjunctiva within normal limits. EOMI bilaterally.   Assessment and Plan: 1.  HSV infection - valACYclovir (VALTREX) 1000 MG tablet; Take 2 tablets (2,000 mg total) by mouth 2 (two) times daily for 1 day.  Dispense: 4 tablet; Refill: 0 Prior outbreaks. Current labial flare but with isolated vesicular lesion of R cheek. No ocular involvement. This is newer for him. However giving bilateral symptoms, this lowers concern for herpes zoster. Still strict ER precautions reviewed. Will Rx Valtrex 2 g BID x 1 day for suppressive therapy. Supportive measures and OTC medications reviewed.   Follow Up Instructions: I discussed the assessment and treatment plan with the patient. The patient was provided an opportunity to ask questions and all were answered. The patient agreed with the plan and demonstrated an understanding of the instructions.  A copy of instructions were sent to the patient via MyChart.  The patient was advised to call back or seek an in-person evaluation if the symptoms worsen or if the condition fails to improve as anticipated.  Time:  I spent 15 minutes with the patient via telehealth technology discussing the above problems/concerns.    Piedad Climes, PA-C

## 2021-07-10 ENCOUNTER — Telehealth (INDEPENDENT_AMBULATORY_CARE_PROVIDER_SITE_OTHER): Payer: BC Managed Care – PPO | Admitting: Family Medicine

## 2021-07-10 ENCOUNTER — Encounter: Payer: Self-pay | Admitting: Family Medicine

## 2021-07-10 ENCOUNTER — Other Ambulatory Visit: Payer: Self-pay

## 2021-07-10 DIAGNOSIS — U071 COVID-19: Secondary | ICD-10-CM | POA: Insufficient documentation

## 2021-07-10 MED ORDER — HYDROCODONE BIT-HOMATROP MBR 5-1.5 MG/5ML PO SOLN: 5.0000 mL | Freq: Three times a day (TID) | ORAL | 0 refills | Status: DC | PRN

## 2021-07-10 MED ORDER — MOLNUPIRAVIR EUA 200MG CAPSULE: 4.0000 | ORAL_CAPSULE | Freq: Two times a day (BID) | ORAL | 0 refills | Status: AC

## 2021-07-10 NOTE — Progress Notes (Signed)
MyChart Video Visit    Virtual Visit via Video Note   This visit type was conducted due to national recommendations for restrictions regarding the COVID-19 Pandemic (e.g. social distancing) in an effort to limit this patient's exposure and mitigate transmission in our community. This patient is at least at moderate risk for complications without adequate follow up. This format is felt to be most appropriate for this patient at this time. Physical exam was limited by quality of the video and audio technology used for the visit. Thelma Barge  was able to get the patient set up on a video visit.  Patient location: Home Patient and provider in visit Provider location: Office  I discussed the limitations of evaluation and management by telemedicine and the availability of in person appointments. The patient expressed understanding and agreed to proceed.  Visit Date: 07/10/2021  Today's healthcare provider: [name], DO     Subjective:    Patient ID: Manuel Foster, male    DOB: October 27, 1961, 60 y.o.   MRN: [id number]  Chief Complaint  Patient presents with   Covid Positive   Cough    HPI Patient is in today for a video visit.   Pt symptoms started Sunday --- pt is having cough and runny nose, no fever .   Pt took home test last night that was covid positive .    The cough is keeping him up at night --- he is not able to go back to work until his cough has resolved.     Past Medical History:  Diagnosis Date   Bronchitis    Frequent headaches    Seizures (HCC)    As a child    Past Surgical History:  Procedure Laterality Date   EYE SURGERY Left    eyelid surgery   WISDOM TOOTH EXTRACTION      Family History  Problem Relation Age of Onset   Stroke Mother    Diabetes Father    Alcohol abuse Brother    Diabetes Sister    Alcohol abuse Brother     Social History   Socioeconomic History   Marital status: Married    Spouse name: Not on file    Number of children: Not on file   Years of education: Not on file   Highest education level: Not on file  Occupational History    Employer: [employer]    Comment: P/T    Comment: palate and box  Tobacco Use   Smoking status: Never   Smokeless tobacco: Never  Substance and Sexual Activity   Alcohol use: No    Alcohol/week: 0.0 standard drinks   Drug use: No   Sexual activity: Yes    Partners: Female  Other Topics Concern   Not on file  Social History Narrative   Exercise-- work with ups   Social Determinants of Health   Financial Resource Strain: Not on file  Food Insecurity: Not on file  Transportation Needs: Not on file  Physical Activity: Not on file  Stress: Not on file  Social Connections: Not on file  Intimate Partner Violence: Not on file    Outpatient Medications Prior to Visit  Medication Sig Dispense Refill   albuterol (VENTOLIN HFA) 108 (90 Base) MCG/ACT inhaler Inhale 2 puffs into the lungs every 6 (six) hours as needed for wheezing or shortness of breath. 1 each 0   Naftifine HCl 2 % CREA APPLY DAILY TO AFFECTED AREA. 45 g 1  No facility-administered medications prior to visit.    No Known Allergies  Review of Systems  Constitutional:  Negative for chills and fever.  HENT:  Negative for congestion, ear pain, sinus pain, sore throat and tinnitus.   Respiratory:  Positive for cough. Negative for sputum production, shortness of breath and wheezing.   Cardiovascular:  Negative for chest pain.      Objective:    Physical Exam Vitals and nursing note reviewed.  Pulmonary:     Effort: Pulmonary effort is normal.  Psychiatric:        Mood and Affect: Mood normal.        Behavior: Behavior normal.        Thought Content: Thought content normal.        Judgment: Judgment normal.    There were no vitals taken for this visit. Wt Readings from Last 3 Encounters:  02/14/20 201 lb 9.6 oz (91.4 kg)  05/29/17 178 lb 12.8 oz (81.1 kg)  06/29/16 191 lb 9.6 oz  (86.9 kg)    Diabetic Foot Exam - Simple   No data filed    Lab Results  Component Value Date   WBC 6.7 05/29/2017   HGB 13.3 05/29/2017   HCT 39.6 05/29/2017   PLT 270.0 05/29/2017   GLUCOSE 78 05/29/2017   CHOL 245 (H) 03/07/2015   TRIG 52.0 03/07/2015   HDL 59.30 03/07/2015   LDLCALC 175 (H) 03/07/2015   ALT 24 05/29/2017   AST 31 05/29/2017   NA 141 05/29/2017   K 4.0 05/29/2017   CL 104 05/29/2017   CREATININE 1.08 05/29/2017   BUN 17 05/29/2017   CO2 29 05/29/2017   TSH 1.74 05/29/2017   PSA 0.61 05/29/2017    Lab Results  Component Value Date   TSH 1.74 05/29/2017   Lab Results  Component Value Date   WBC 6.7 05/29/2017   HGB 13.3 05/29/2017   HCT 39.6 05/29/2017   MCV 85.0 05/29/2017   PLT 270.0 05/29/2017   Lab Results  Component Value Date   NA 141 05/29/2017   K 4.0 05/29/2017   CO2 29 05/29/2017   GLUCOSE 78 05/29/2017   BUN 17 05/29/2017   CREATININE 1.08 05/29/2017   BILITOT 0.6 05/29/2017   ALKPHOS 62 05/29/2017   AST 31 05/29/2017   ALT 24 05/29/2017   PROT 7.8 05/29/2017   ALBUMIN 4.4 05/29/2017   CALCIUM 9.7 05/29/2017   GFR 91.77 05/29/2017   Lab Results  Component Value Date   CHOL 245 (H) 03/07/2015   Lab Results  Component Value Date   HDL 59.30 03/07/2015   Lab Results  Component Value Date   LDLCALC 175 (H) 03/07/2015   Lab Results  Component Value Date   TRIG 52.0 03/07/2015   Lab Results  Component Value Date   CHOLHDL 4 03/07/2015   No results found for: HGBA1C     Assessment & Plan:   Problem List Items Addressed This Visit       Unprioritized   COVID-19 - Primary    Antiviral sent in Cough med per orders Work note printed Quarantine for 5 days then he may return to work if he is fever free and symptoms are improving but he must where a mask for 5 more days        Relevant Medications   HYDROcodone bit-homatropine (HYCODAN) 5-1.5 MG/5ML syrup   molnupiravir EUA 200 mg CAPS     Meds  ordered this encounter  Medications  HYDROcodone bit-homatropine (HYCODAN) 5-1.5 MG/5ML syrup    Sig: Take 5 mLs by mouth every 8 (eight) hours as needed for cough.    Dispense:  120 mL    Refill:  0   molnupiravir EUA 200 mg CAPS    Sig: Take 4 capsules (800 mg total) by mouth 2 (two) times daily for 5 days.    Dispense:  40 capsule    Refill:  0    I discussed the assessment and treatment plan with the patient. The patient was provided an opportunity to ask questions and all were answered. The patient agreed with the plan and demonstrated an understanding of the instructions.   The patient was advised to call back or seek an in-person evaluation if the symptoms worsen or if the condition fails to improve as anticipated.  I provided 20 minutes of face-to-face time during this encounter.    [name], DO [facility] at [facility] [phone] (phone) [phone] (fax)  [facility]

## 2021-07-10 NOTE — Assessment & Plan Note (Signed)
Antiviral sent in Cough med per orders Work note printed Quarantine for 5 days then he may return to work if he is fever free and symptoms are improving but he must where a mask for 5 more days

## 2021-10-11 ENCOUNTER — Telehealth (INDEPENDENT_AMBULATORY_CARE_PROVIDER_SITE_OTHER): Payer: BC Managed Care – PPO | Admitting: Family Medicine

## 2021-10-11 VITALS — Temp 97.8°F

## 2021-10-11 DIAGNOSIS — Z8709 Personal history of other diseases of the respiratory system: Secondary | ICD-10-CM | POA: Diagnosis not present

## 2021-10-11 DIAGNOSIS — U071 COVID-19: Secondary | ICD-10-CM | POA: Diagnosis not present

## 2021-10-11 DIAGNOSIS — Z87898 Personal history of other specified conditions: Secondary | ICD-10-CM | POA: Diagnosis not present

## 2021-10-11 DIAGNOSIS — R051 Acute cough: Secondary | ICD-10-CM

## 2021-10-11 MED ORDER — AZITHROMYCIN 250 MG PO TABS: ORAL_TABLET | ORAL | 0 refills | Status: DC

## 2021-10-11 MED ORDER — BENZONATATE 100 MG PO CAPS: 100.0000 mg | ORAL_CAPSULE | Freq: Three times a day (TID) | ORAL | 0 refills | Status: DC | PRN

## 2021-10-11 MED ORDER — HYDROCODONE BIT-HOMATROP MBR 5-1.5 MG/5ML PO SOLN: 5.0000 mL | Freq: Every evening | ORAL | 0 refills | Status: DC | PRN

## 2021-10-11 MED ORDER — ALBUTEROL SULFATE HFA 108 (90 BASE) MCG/ACT IN AERS
1.0000 | INHALATION_SPRAY | RESPIRATORY_TRACT | 0 refills | Status: DC | PRN
Start: 2021-10-11 — End: 2023-09-17

## 2021-10-11 NOTE — Progress Notes (Signed)
Virtual Visit via audio. Note  I attempted visit with Manuel Foster on 10/11/21 at 12:01 PM by a video enabled telemedicine application  - not on [employer].  Repeat invite sent and when not connected, I called cell at 12:10 - LM to CB.  Technical issues connecting to video - changed to audio visit. 12:12 verified that I am speaking with the correct person using two identifiers.  Patient location: in car,  3 way call with wife Manuel Foster - consent obtained. All questions My location: office -[place].    I discussed the limitations, risks, security and privacy concerns of performing an evaluation and management service by telephone and the availability of in person appointments. I also discussed with the patient that there may be a patient responsible charge related to this service. The patient expressed understanding and agreed to proceed, consent obtained  Chief complaint:  Chief Complaint  Patient presents with   Cough    Pt reports cough, no fever, sore throat,  sinus drainage, no COVID test available at home     History of Present Illness: Manuel Foster is a 60 y.o. male  Cough Current cough past 2-3 days worse yesterday - more cough.  Runny nose, dry cough.  No body aches, no current HA.  No dyspnea. No wheeze. Cough causes sore throat. Eating and drinking ok. Drinking water.  No chest pains, no dyspnea. No wheeze - not using inhaler.  No fever.  Sick contacts - none.  Has not performed covid test.  Attempted treatments: hydrocodone cough syrup last night - min change. Ran out, Cough is worse at night.  Unknown if inhaler at home.  [place] otc. Controlled substance database (PDMP) reviewed. No concerns appreciated.   History of bronchitis per spouse.   Flu vaccine: has not taken COVID risk of complication score 2. COVID-vaccine: had vaccine, no booster COVID infection  in August. with positive test 07/09/21, video visit August 9 with PCP.  Treated with  antivirals. Followed by Dr. Zola Foster as PCP   Patient Active Problem List   Diagnosis Date Noted   COVID-19 07/10/2021   Fungal infection of skin 10/07/2019   COVID-19 virus detected 05/31/2019   Loss of weight 05/29/2017   ROM 05/11/2008   DERMATITIS 09/04/2007   DERMATITIS, CONTACT, DUE TO PLANTS 05/20/2007   CELLULITIS/ABSCESS, SITE NEC 05/06/2007   Past Medical History:  Diagnosis Date   Bronchitis    Frequent headaches    Seizures (HCC)    As a child   Past Surgical History:  Procedure Laterality Date   EYE SURGERY Left    eyelid surgery   WISDOM TOOTH EXTRACTION     No Known Allergies Prior to Admission medications   Medication Sig Start Date End Date Taking? Authorizing Provider  HYDROcodone bit-homatropine (HYCODAN) 5-1.5 MG/5ML syrup Take 5 mLs by mouth every 8 (eight) hours as needed for cough. Patient not taking: Reported on 10/11/2021 07/10/21   Manuel Schultz, DO   Social History   Socioeconomic History   Marital status: Married    Spouse name: Not on file   Number of children: Not on file   Years of education: Not on file   Highest education level: Not on file  Occupational History    Employer: [employer]    Comment: P/T    Comment: palate and box  Tobacco Use   Smoking status: Never   Smokeless tobacco: Never  Substance and Sexual Activity   Alcohol use: No  Alcohol/week: 0.0 standard drinks   Drug use: No   Sexual activity: Yes    Partners: Female  Other Topics Concern   Not on file  Social History Narrative   Exercise-- work with ups   Social Determinants of Health   Financial Resource Strain: Not on file  Food Insecurity: Not on file  Transportation Needs: Not on file  Physical Activity: Not on file  Stress: Not on file  Social Connections: Not on file  Intimate Partner Violence: Not on file    Observations/Objective: Vitals:   10/11/21 1121  Temp: 97.8 F (36.6 C)  No respiratory distress on phone.  Speaking full  sentences, no cough heard during exam.  No audible wheeze or stridor.  Appropriate responses.   Assessment and Plan: Acute cough - Plan: benzonatate (TESSALON) 100 MG capsule  COVID-19 - Plan: HYDROcodone bit-homatropine (HYCODAN) 5-1.5 MG/5ML syrup  History of wheezing - Plan: albuterol (VENTOLIN HFA) 108 (90 Base) MCG/ACT inhaler  History of bronchitis - Plan: azithromycin (ZITHROMAX) 250 MG tablet  Suspected viral illness, 2 to 3 days of cough.  No apparent wheeze, no fever, body aches, recent COVID infection in August, less likely recurrence.  -Home COVID testing discussed but again less likely.  Without fever less likely flu.  -Symptomatic care discussed with Tessalon Perles during the day, Hycodan cough syrup at night if needed.  -No current wheeze, but albuterol prescribed if wheeze noted or shortness of breath with cough and RTC/ER precautions given.  -Discussed likely viral cause at this time but given history of bronchitis azithromycin sent to pharmacy.  Option to wait through the weekend to see if symptoms improve as may not need antibiotics.  Potential side effects and risks of antibiotics were discussed.  RTC/ER precautions given.  Follow Up Instructions:   I discussed the assessment and treatment plan with the patient. The patient was provided an opportunity to ask questions and all were answered. The patient agreed with the plan and demonstrated an understanding of the instructions.   The patient was advised to call back or seek an in-person evaluation if the symptoms worsen or if the condition fails to improve as anticipated.  I provided 27 minutes of non-face-to-face time during this encounter.   Manuel Flood, MD

## 2021-11-12 ENCOUNTER — Telehealth (INDEPENDENT_AMBULATORY_CARE_PROVIDER_SITE_OTHER): Payer: BC Managed Care – PPO | Admitting: Family Medicine

## 2021-11-12 ENCOUNTER — Encounter: Payer: Self-pay | Admitting: Family Medicine

## 2021-11-12 DIAGNOSIS — J40 Bronchitis, not specified as acute or chronic: Secondary | ICD-10-CM

## 2021-11-12 DIAGNOSIS — J069 Acute upper respiratory infection, unspecified: Secondary | ICD-10-CM | POA: Diagnosis not present

## 2021-11-12 MED ORDER — AZITHROMYCIN 250 MG PO TABS: ORAL_TABLET | ORAL | 0 refills | Status: DC

## 2021-11-12 MED ORDER — FLUTICASONE PROPIONATE 50 MCG/ACT NA SUSP: 2.0000 | Freq: Every day | NASAL | 6 refills | Status: AC

## 2021-11-12 MED ORDER — BENZONATATE 100 MG PO CAPS
200.0000 mg | ORAL_CAPSULE | Freq: Three times a day (TID) | ORAL | 0 refills | Status: DC | PRN
Start: 2021-11-12 — End: 2022-03-29

## 2021-11-12 MED ORDER — LORATADINE 10 MG PO TABS: 10.0000 mg | ORAL_TABLET | Freq: Every day | ORAL | 11 refills | Status: AC

## 2021-11-12 NOTE — Progress Notes (Signed)
MyChart Video Visit    Virtual Visit via Video Note   This visit type was conducted due to national recommendations for restrictions regarding the COVID-19 Pandemic (e.g. social distancing) in an effort to limit this patient'Foster exposure and mitigate transmission in our community. This patient is at least at moderate risk for complications without adequate follow up. This format is felt to be most appropriate for this patient at this time. Physical exam was limited by quality of the video and audio technology used for the visit. Manuel Foster was able to get the patient set up on a video visit.  Patient location: Home Patient and provider in visit Provider location: Work  I discussed the limitations of evaluation and management by telemedicine and the availability of in person appointments. The patient expressed understanding and agreed to proceed.  Visit Date: 11/12/2021  Today'Foster healthcare provider: [name], DO      Subjective:    Patient ID: JRU PENSE, male    DOB: [birth year], 60 y.o.   MRN: [id number]  Chief Complaint  Patient presents with   Cough    HPI Patient is in today for a video visit. Was switched to phone visit due to limitations.   He reports he has had a cough, rhinorrhea and sore throat for the past week. Denies fever and congestion. Adds that the symptoms are worse in the night. He has not used any medications and is still able to sleep at night.     Past Medical History:  Diagnosis Date   Bronchitis    Frequent headaches    Seizures (HCC)    As a child    Past Surgical History:  Procedure Laterality Date   EYE SURGERY Left    eyelid surgery   WISDOM TOOTH EXTRACTION      Family History  Problem Relation Age of Onset   Stroke Mother    Diabetes Father    Alcohol abuse Brother    Diabetes Sister    Alcohol abuse Brother     Social History   Socioeconomic History   Marital status: Married    Spouse name: Not on file    Number of children: Not on file   Years of education: Not on file   Highest education level: Not on file  Occupational History    Employer: [employer]    Comment: P/T    Comment: palate and box  Tobacco Use   Smoking status: Never   Smokeless tobacco: Never  Substance and Sexual Activity   Alcohol use: No    Alcohol/week: 0.0 standard drinks   Drug use: No   Sexual activity: Yes    Partners: Female  Other Topics Concern   Not on file  Social History Narrative   Exercise-- work with ups   Social Determinants of Health   Financial Resource Strain: Not on file  Food Insecurity: Not on file  Transportation Needs: Not on file  Physical Activity: Not on file  Stress: Not on file  Social Connections: Not on file  Intimate Partner Violence: Not on file    Outpatient Medications Prior to Visit  Medication Sig Dispense Refill   albuterol (VENTOLIN HFA) 108 (90 Base) MCG/ACT inhaler Inhale 1-2 puffs into the lungs every 4 (four) hours as needed for wheezing or shortness of breath. 1 each 0   azithromycin (ZITHROMAX) 250 MG tablet Take 2 pills by mouth on day 1, then 1 pill by mouth per day on days 2  through 5. 6 tablet 0   benzonatate (TESSALON) 100 MG capsule Take 1 capsule (100 mg total) by mouth 3 (three) times daily as needed for cough. 20 capsule 0   HYDROcodone bit-homatropine (HYCODAN) 5-1.5 MG/5ML syrup Take 5 mLs by mouth at bedtime as needed for cough. 120 mL 0   No facility-administered medications prior to visit.    No Known Allergies  Review of Systems  Constitutional:  Negative for fever and malaise/fatigue.  HENT:  Positive for sore throat. Negative for congestion.        (+) rhinorrhea  Eyes:  Negative for blurred vision.  Respiratory:  Positive for cough. Negative for shortness of breath.   Cardiovascular:  Negative for chest pain, palpitations and leg swelling.  Gastrointestinal:  Negative for abdominal pain, blood in stool and nausea.  Genitourinary:  Negative for  dysuria and frequency.  Musculoskeletal:  Negative for falls.  Skin:  Negative for rash.  Neurological:  Negative for dizziness, loss of consciousness and headaches.  Endo/Heme/Allergies:  Negative for environmental allergies.  Psychiatric/Behavioral:  Negative for depression. The patient is not nervous/anxious.       Objective:    Physical Exam Vitals and nursing note reviewed.  Constitutional:      General: He is not in acute distress.    Appearance: He is well-developed.  Eyes:     Pupils: Pupils are equal, round, and reactive to light.  Neck:     Thyroid: No thyromegaly.  Pulmonary:     Effort: Pulmonary effort is normal.  Musculoskeletal:        General: No tenderness.     Right hip: Normal range of motion. Normal strength.     Left hip: Normal range of motion. Normal strength.     Right foot: Bony tenderness present. No swelling.     Left foot: Bony tenderness present. No swelling.  Neurological:     Mental Status: He is alert and oriented to person, place, and time.    There were no vitals taken for this visit. Wt Readings from Last 3 Encounters:  02/14/20 201 lb 9.6 oz (91.4 kg)  05/29/17 178 lb 12.8 oz (81.1 kg)  06/29/16 191 lb 9.6 oz (86.9 kg)    Diabetic Foot Exam - Simple   No data filed    Lab Results  Component Value Date   WBC 6.7 05/29/2017   HGB 13.3 05/29/2017   HCT 39.6 05/29/2017   PLT 270.0 05/29/2017   GLUCOSE 78 05/29/2017   CHOL 245 (H) 03/07/2015   TRIG 52.0 03/07/2015   HDL 59.30 03/07/2015   LDLCALC 175 (H) 03/07/2015   ALT 24 05/29/2017   AST 31 05/29/2017   NA 141 05/29/2017   K 4.0 05/29/2017   CL 104 05/29/2017   CREATININE 1.08 05/29/2017   BUN 17 05/29/2017   CO2 29 05/29/2017   TSH 1.74 05/29/2017   PSA 0.61 05/29/2017    Lab Results  Component Value Date   TSH 1.74 05/29/2017   Lab Results  Component Value Date   WBC 6.7 05/29/2017   HGB 13.3 05/29/2017   HCT 39.6 05/29/2017   MCV 85.0 05/29/2017   PLT  270.0 05/29/2017   Lab Results  Component Value Date   NA 141 05/29/2017   K 4.0 05/29/2017   CO2 29 05/29/2017   GLUCOSE 78 05/29/2017   BUN 17 05/29/2017   CREATININE 1.08 05/29/2017   BILITOT 0.6 05/29/2017   ALKPHOS 62 05/29/2017   AST 31 05/29/2017  ALT 24 05/29/2017   PROT 7.8 05/29/2017   ALBUMIN 4.4 05/29/2017   CALCIUM 9.7 05/29/2017   GFR 91.77 05/29/2017   Lab Results  Component Value Date   CHOL 245 (H) 03/07/2015   Lab Results  Component Value Date   HDL 59.30 03/07/2015   Lab Results  Component Value Date   LDLCALC 175 (H) 03/07/2015   Lab Results  Component Value Date   TRIG 52.0 03/07/2015   Lab Results  Component Value Date   CHOLHDL 4 03/07/2015   No results found for: HGBA1C     Assessment & Plan:   Problem List Items Addressed This Visit   None Visit Diagnoses     Bronchitis    -  Primary   Relevant Medications   azithromycin (ZITHROMAX Z-PAK) 250 MG tablet   fluticasone (FLONASE) 50 MCG/ACT nasal spray   loratadine (CLARITIN) 10 MG tablet   benzonatate (TESSALON PERLES) 100 MG capsule   Viral upper respiratory tract infection       Relevant Medications   azithromycin (ZITHROMAX Z-PAK) 250 MG tablet   fluticasone (FLONASE) 50 MCG/ACT nasal spray   loratadine (CLARITIN) 10 MG tablet       @ENCMEDP @  Meds ordered this encounter  Medications   azithromycin (ZITHROMAX Z-PAK) 250 MG tablet    Sig: As directed    Dispense:  6 each    Refill:  0   fluticasone (FLONASE) 50 MCG/ACT nasal spray    Sig: Place 2 sprays into both nostrils daily.    Dispense:  16 g    Refill:  6   loratadine (CLARITIN) 10 MG tablet    Sig: Take 1 tablet (10 mg total) by mouth daily.    Dispense:  30 tablet    Refill:  11   benzonatate (TESSALON PERLES) 100 MG capsule    Sig: Take 2 capsules (200 mg total) by mouth 3 (three) times daily as needed for cough.    Dispense:  30 capsule    Refill:  0    I discussed the assessment and treatment plan  with the patient. The patient was provided an opportunity to ask questions and all were answered. The patient agreed with the plan and demonstrated an understanding of the instructions.   The patient was advised to call back or seek an in-person evaluation if the symptoms worsen or if the condition fails to improve as anticipated.  I provided 20 minutes of face-to-face time during this encounter.   I,[name],acting as a for Neurosurgeon, DO.,have documented all relevant documentation on the behalf of [employer], DO,as directed by  [name], DO while in the presence of [name], DO.      [name], DO [facility] at [name] [phone] (phone) [phone] (fax)  [facility]

## 2022-03-29 ENCOUNTER — Encounter: Payer: Self-pay | Admitting: Family Medicine

## 2022-03-29 ENCOUNTER — Ambulatory Visit: Payer: BC Managed Care – PPO | Admitting: Family Medicine

## 2022-03-29 ENCOUNTER — Ambulatory Visit (HOSPITAL_BASED_OUTPATIENT_CLINIC_OR_DEPARTMENT_OTHER)
Admission: RE | Admit: 2022-03-29 | Discharge: 2022-03-29 | Disposition: A | Discharge status: Home / Self Care | Payer: BC Managed Care – PPO | Source: Ambulatory Visit | Attending: Family Medicine | Admitting: Family Medicine

## 2022-03-29 VITALS — BP 138/90 | HR 83 | Temp 98.8°F | Resp 18 | Ht 67.0 in | Wt 200.4 lb

## 2022-03-29 DIAGNOSIS — S0990XA Unspecified injury of head, initial encounter: Secondary | ICD-10-CM | POA: Insufficient documentation

## 2022-03-29 MED ORDER — CYCLOBENZAPRINE HCL 10 MG PO TABS: 10.0000 mg | ORAL_TABLET | Freq: Three times a day (TID) | ORAL | 0 refills | Status: DC | PRN
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
## 2022-03-29 NOTE — Patient Instructions (Signed)
Concussion, Adult ? ?A concussion is a brain injury from a hard, direct hit (trauma) to the head or body. This direct hit causes the brain to shake quickly back and forth inside the skull. This can damage brain cells and cause chemical changes in the brain. A concussion may also be known as a mild traumatic brain injury (TBI). ?Concussions are usually not life-threatening, but the effects of a concussion can be serious. If you have a concussion, you should be very careful to avoid having a second concussion. ?What are the causes? ?This condition is caused by: ?A direct hit to your head, such as: ?Running into another player during a game. ?Being hit in a fight. ?Hitting your head on a hard surface. ?Sudden movement of your body that causes your brain to move back and forth inside the skull, such as in a car crash. ?What are the signs or symptoms? ?The signs of a concussion can be hard to notice. Early on, they may be missed by you, family members, and health care providers. You may look fine on the outside but may act or feel differently. ?Every head injury is different. Symptoms are usually temporary but may last for days, weeks, or even months. Some symptoms appear right away, but other symptoms may not show up for hours or days. If your symptoms last longer than normal, you may have post-concussion syndrome. ?Physical symptoms ?Headaches. ?Dizziness and problems with coordination or balance. ?Sensitivity to light or noise. ?Nausea or vomiting. ?Tiredness (fatigue). ?Vision or hearing problems. ?Changes in eating or sleeping patterns. ?Seizure. ?Mental and emotional symptoms ?Irritability or mood changes. ?Memory problems. ?Trouble concentrating, organizing, or making decisions. ?Slowness in thinking, acting or reacting, speaking, or reading. ?Anxiety or depression. ?How is this diagnosed? ?This condition is diagnosed based on: ?Your symptoms. ?A description of your injury. ?You may also have tests,  including: ?Imaging tests, such as a CT scan or an MRI. ?Neuropsychological tests. These measure your thinking, understanding, learning, and remembering abilities. ?How is this treated? ?Treatment for this condition includes: ?Stopping sports or activity if you are injured. If you hit your head or show signs of concussion: ?Do not return to sports or activities the same day. ?Get checked by a health care provider before you return to your activities. ?Physical and mental rest and careful observation, usually at home. Gradually return to your normal activities. ?Medicines to help with symptoms such as headaches, nausea, or difficulty sleeping. ?Avoid taking opioid pain medicine while recovering from a concussion. ?Avoiding alcohol and drugs. These may slow your recovery and can put you at risk of further injury. ?Referral to a concussion clinic or rehabilitation center. ?Recovery from a concussion can take time. How fast you recover depends on many factors. Return to activities only when: ?Your symptoms are completely gone. ?Your health care provider says that it is safe. ?Follow these instructions at home: ?Activity ?Limit activities that require a lot of thought or concentration, such as: ?Doing homework or job-related work. ?Watching TV. ?Working on the computer or phone. ?Playing memory games and puzzles. ?Rest. Rest helps your brain heal. Make sure you: ?Get plenty of sleep. Most adults should get 7-9 hours of sleep each night. ?Rest during the day. Take naps or rest breaks when you feel tired. ?Avoid physical activity like exercise until your health care provider says it is safe. Stop any activity that worsens symptoms. ?Do not do high-risk activities that could cause a second concussion, such as riding a bike or playing   sports. ?Ask your health care provider when you can return to your normal activities, such as school, work, athletics, and driving. Your ability to react may be slower after a brain injury.  Never do these activities if you are dizzy. Your health care provider will likely give you a plan for gradually returning to activities. ?General instructions ? ?Take over-the-counter and prescription medicines only as told by your health care provider. Some medicines, such as blood thinners (anticoagulants) and aspirin, may increase the risk for complications, such as bleeding. ?Do not drink alcohol until your health care provider says you can. ?Watch your symptoms and tell others around you to do the same. Complications sometimes occur after a concussion. Older adults with a brain injury may have a higher risk of serious complications. ?Tell your work manager, teachers, school nurse, school counselor, coach, or athletic trainer about your injury, symptoms, and restrictions. ?Keep all follow-up visits as told by your health care provider. This is important. ?How is this prevented? ?Avoiding another brain injury is very important. In rare cases, another injury can lead to permanent brain damage, brain swelling, or death. The risk of this is greatest during the first 7-10 days after a head injury. Avoid injuries by: ?Stopping activities that could lead to a second concussion, such as contact or recreational sports, until your health care provider says it is okay. ?Taking these actions once you have returned to sports or activities: ?Avoiding plays or moves that can cause you to crash into another person. This is how most concussions occur. ?Following the rules and being respectful of other players. Do not engage in violent or illegal plays. ?Getting regular exercise that includes strength and balance training. ?Wearing a properly fitting helmet during sports, biking, or other activities. Helmets can help protect you from serious skull and brain injuries, but they may not protect you from a concussion. Even when wearing a helmet, you should avoid being hit in the head. ?Contact a health care provider if: ?Your  symptoms do not improve. ?You have new symptoms. ?You have another injury. ?Get help right away if: ?You have new or worsening physical symptoms, such as: ?A severe or worsening headache. ?Weakness or numbness in any part of your body, slurred speech, vision changes, or confusion. ?Your coordination gets worse. ?Vomiting repeatedly. ?You have a seizure. ?You have unusual behavior changes. ?You lose consciousness, are sleepier than normal, or are difficult to wake up. ?These symptoms may represent a serious problem that is an emergency. Do not wait to see if the symptoms will go away. Get medical help right away. Call your local emergency services (911 in the [place] Do not drive yourself to the hospital. ?Summary ?A concussion is a brain injury that results from a hard, direct hit (trauma) to your head or body. ?You may have imaging tests and neuropsychological tests to diagnose a concussion. ?Treatment for this condition includes physical and mental rest and careful observation. ?Ask your health care provider when you can return to your normal activities, such as school, work, athletics, and driving. ?Get help right away if you have a severe headache, weakness in any part of the body, seizures, behavior changes, changes in vision, or if you are confused or sleepier than normal. ?This information is not intended to replace advice given to you by your health care provider. Make sure you discuss any questions you have with your health care provider. ?Document Revised: 02/01/2021 Document Reviewed: 02/01/2021 ?Elsevier Patient Education ? 2023 [employer] ? ?

## 2023-09-17 ENCOUNTER — Ambulatory Visit: Payer: BC Managed Care – PPO | Admitting: Physician Assistant

## 2023-09-17 ENCOUNTER — Encounter: Payer: Self-pay | Admitting: Physician Assistant

## 2023-09-17 VITALS — BP 138/92 | HR 65 | Temp 97.9°F | Ht 67.0 in | Wt 197.5 lb

## 2023-09-17 DIAGNOSIS — R6889 Other general symptoms and signs: Secondary | ICD-10-CM

## 2023-09-17 DIAGNOSIS — U071 COVID-19: Secondary | ICD-10-CM | POA: Diagnosis not present

## 2023-09-17 LAB — POCT INFLUENZA A/B
Influenza A, POC: NEGATIVE
Influenza B, POC: NEGATIVE

## 2023-09-17 LAB — POC COVID19 BINAXNOW: SARS Coronavirus 2 Ag: POSITIVE — AB

## 2023-09-17 MED ORDER — ALBUTEROL SULFATE HFA 108 (90 BASE) MCG/ACT IN AERS
2.0000 | INHALATION_SPRAY | Freq: Four times a day (QID) | RESPIRATORY_TRACT | 2 refills | Status: DC | PRN
Start: 2023-09-17 — End: 2024-09-03

## 2023-09-17 MED ORDER — PREDNISONE 20 MG PO TABS
20.0000 mg | ORAL_TABLET | Freq: Every day | ORAL | 0 refills | Status: DC
Start: 2023-09-17 — End: 2024-03-15

## 2023-09-17 NOTE — Progress Notes (Signed)
Established patient visit   Patient: Manuel Foster   DOB: Mar 04, 1961   62 y.o. Male  MRN: [id number] Visit Date: 09/17/2023  Today's healthcare provider: Alfredia Ferguson, PA-C   Chief Complaint  Patient presents with   Flu like symptoms    Patient states symptoms have been here since Friday, no fever. Cough, sore throat, runny nose. When coughing, throat burns   Subjective     Pt reports cough, sore throat, body aches, fatigue, nasal congestion x 5 days. Reports slight shortness of breath/wheezing.  Medications: Outpatient Medications Prior to Visit  Medication Sig   cyclobenzaprine (FLEXERIL) 10 MG tablet Take 1 tablet (10 mg total) by mouth 3 (three) times daily as needed for muscle spasms.   fluticasone (FLONASE) 50 MCG/ACT nasal spray Place 2 sprays into both nostrils daily.   HYDROcodone bit-homatropine (HYCODAN) 5-1.5 MG/5ML syrup Take 5 mLs by mouth at bedtime as needed for cough.   loratadine (CLARITIN) 10 MG tablet Take 1 tablet (10 mg total) by mouth daily.   [DISCONTINUED] albuterol (VENTOLIN HFA) 108 (90 Base) MCG/ACT inhaler Inhale 1-2 puffs into the lungs every 4 (four) hours as needed for wheezing or shortness of breath.   No facility-administered medications prior to visit.    Review of Systems  Constitutional:  Positive for fatigue. Negative for fever.  HENT:  Positive for congestion, postnasal drip and sore throat.   Respiratory:  Positive for cough, shortness of breath and wheezing.   Cardiovascular:  Negative for chest pain, palpitations and leg swelling.  Neurological:  Negative for dizziness and headaches.       Objective    BP (!) 138/92   Pulse 65   Temp 97.9 F (36.6 C)   Ht 5\' 7"  (1.702 m)   Wt 197 lb 8 oz (89.6 kg)   SpO2 97%   BMI 30.93 kg/m    Physical Exam Constitutional:      General: He is awake.     Appearance: He is well-developed.  HENT:     Head: Normocephalic.  Eyes:     Conjunctiva/sclera: Conjunctivae normal.   Cardiovascular:     Rate and Rhythm: Normal rate and regular rhythm.     Heart sounds: Normal heart sounds.  Pulmonary:     Effort: Pulmonary effort is normal.     Breath sounds: Wheezing present.     Comments: Slight wheezing b/l Skin:    General: Skin is warm.  Neurological:     Mental Status: He is alert and oriented to person, place, and time.  Psychiatric:        Attention and Perception: Attention normal.        Mood and Affect: Mood normal.        Speech: Speech normal.        Behavior: Behavior is cooperative.      Results for orders placed or performed in visit on 09/17/23  POC COVID-19  Result Value Ref Range   SARS Coronavirus 2 Ag Positive (A) Negative  POCT Influenza A/B  Result Value Ref Range   Influenza A, POC Negative Negative   Influenza B, POC Negative Negative    Assessment & Plan    1. COVID-19 2. Flu-like symptoms Poc covid pos, flu negative Recommending rest, hydration, otc mucinex Rx albuterol inhaler and prednisone 20 mg x 5 days   - POC COVID-19 - POCT Influenza A/B  Return if symptoms worsen or fail to improve.  Alfredia Ferguson, PA-C  [facility] [phone] (phone) [phone] (fax)  [facility]

## 2023-09-24 ENCOUNTER — Ambulatory Visit (INDEPENDENT_AMBULATORY_CARE_PROVIDER_SITE_OTHER): Payer: BC Managed Care – PPO | Admitting: Medical

## 2023-09-24 ENCOUNTER — Encounter: Payer: Self-pay | Admitting: Medical

## 2023-09-24 VITALS — BP 140/80 | HR 56 | Temp 98.1°F | Resp 18 | Ht 67.0 in | Wt 198.4 lb

## 2023-09-24 DIAGNOSIS — R051 Acute cough: Secondary | ICD-10-CM | POA: Diagnosis not present

## 2023-09-24 DIAGNOSIS — J40 Bronchitis, not specified as acute or chronic: Secondary | ICD-10-CM

## 2023-09-24 MED ORDER — BENZONATATE 100 MG PO CAPS: 100.0000 mg | ORAL_CAPSULE | Freq: Three times a day (TID) | ORAL | 0 refills | Status: DC | PRN

## 2023-09-24 MED ORDER — AZITHROMYCIN 250 MG PO TABS: ORAL_TABLET | ORAL | 0 refills | Status: AC

## 2023-09-24 MED ORDER — FLUTICASONE PROPIONATE 50 MCG/ACT NA SUSP: 2.0000 | Freq: Every day | NASAL | 1 refills | Status: AC

## 2023-09-24 NOTE — Patient Instructions (Addendum)
Bronchitis following covid. possible secondary bacterial infection Persistent dry cough and chest congestion following recent COVID-19 infection. -Start Azithromycin (Z-Pak) for possible secondary bacterial infection. -Start Tessalon Perles (Benzonatate) for cough suppression. -Start Flonase (Fluticasone) nasal spray for possible post-nasal drip contributing to cough. -Continue Albuterol as needed for wheezing. -If cough persists for another 7-10 days, obtain chest x-ray if cough persists past another week.  Follow up in 7-10 days or sooner if needed.

## 2023-09-24 NOTE — Progress Notes (Signed)
Subjective:    Patient ID: Manuel Foster, male    DOB: July 21, 1961, 62 y.o.   MRN: [id number]  HPI  Discussed the use of AI scribe software for clinical note transcription with the patient, who gave verbal consent to proceed.  History of Present Illness   The patient, with a history of recurrent bronchitis, presents with a persistent dry cough and chest congestion following a recent COVID-19 infection. He reports a sore throat and voice changes associated with the cough, but denies any sinus pain or pressure. He describes a sensation of mucus that cannot be expectorated, and a rattling sound with deep breaths. He denies any fever, chills, or sweats.  The patient was previously treated with albuterol and prednisone, but these medications did not alleviate the symptoms. He was unaware of a recommendation for over-the-counter Mucinex. In the past, the patient's bronchitis was successfully managed with a regimen including [place] cough syrup, and antibiotic, and a nasal spray. The patient is seeking a similar treatment approach for his current symptoms.       Review of Systems  Constitutional:  Negative for chills, fatigue and fever.  HENT:  Positive for congestion. Negative for sinus pressure and sinus pain.   Respiratory:  Positive for cough.        See hpi.  Cardiovascular:  Negative for chest pain and palpitations.  Gastrointestinal:  Negative for abdominal pain.  Musculoskeletal:  Negative for back pain.  Neurological:  Negative for dizziness and weakness.  Hematological:  Negative for adenopathy. Does not bruise/bleed easily.   Past Medical History:  Diagnosis Date   Bronchitis    Frequent headaches    Seizures (HCC)    As a child     Social History   Socioeconomic History   Marital status: Married    Spouse name: Not on file   Number of children: Not on file   Years of education: Not on file   Highest education level: Not on file  Occupational History     Employer: [employer]    Comment: P/T    Comment: palate and box  Tobacco Use   Smoking status: Never   Smokeless tobacco: Never  Substance and Sexual Activity   Alcohol use: No    Alcohol/week: 0.0 standard drinks of alcohol   Drug use: No   Sexual activity: Yes    Partners: Female  Other Topics Concern   Not on file  Social History Narrative   Exercise-- work with ups   Social Determinants of Health   Financial Resource Strain: Not on file  Food Insecurity: Not on file  Transportation Needs: Not on file  Physical Activity: Not on file  Stress: Not on file  Social Connections: Not on file  Intimate Partner Violence: Not on file    Past Surgical History:  Procedure Laterality Date   EYE SURGERY Left    eyelid surgery   WISDOM TOOTH EXTRACTION      Family History  Problem Relation Age of Onset   Stroke Mother    Diabetes Father    Alcohol abuse Brother    Diabetes Sister    Alcohol abuse Brother     No Known Allergies  Current Outpatient Medications on File Prior to Visit  Medication Sig Dispense Refill   albuterol (VENTOLIN HFA) 108 (90 Base) MCG/ACT inhaler Inhale 2 puffs into the lungs every 6 (six) hours as needed for wheezing or shortness of breath. 8 g 2   cyclobenzaprine (FLEXERIL) 10 MG  tablet Take 1 tablet (10 mg total) by mouth 3 (three) times daily as needed for muscle spasms. 30 tablet 0   fluticasone (FLONASE) 50 MCG/ACT nasal spray Place 2 sprays into both nostrils daily. 16 g 6   HYDROcodone bit-homatropine (HYCODAN) 5-1.5 MG/5ML syrup Take 5 mLs by mouth at bedtime as needed for cough. 120 mL 0   loratadine (CLARITIN) 10 MG tablet Take 1 tablet (10 mg total) by mouth daily. 30 tablet 11   predniSONE (DELTASONE) 20 MG tablet Take 1 tablet (20 mg total) by mouth daily with breakfast. 5 tablet 0   No current facility-administered medications on file prior to visit.    BP (!) 140/80   Pulse (!) 56   Temp 98.1 F (36.7 C) (Oral)   Resp 18   Ht 5\' 7"   (1.702 m)   Wt 198 lb 6.4 oz (90 kg)   SpO2 98%   BMI 31.07 kg/m         Objective:   Physical Exam  General- No acute distress. Pleasant patient. Neck- Full range of motion, no jvd Lungs- Clear, even and unlabored. Heart- regular rate and rhythm. Neurologic- CNII- XII grossly intact.  Heent- no sinus pressure. Sounds nasal congestion. Lungs- even and unlabored. Very faint rough breath sounds upper lobes equal      Assessment & Plan:   Assessment and Plan  Patient Instructions  Bronchitis following covid. possible secondary bacterial infection Persistent dry cough and chest congestion following recent COVID-19 infection. -Start Azithromycin (Z-Pak) for possible secondary bacterial infection. -Start Tessalon Perles (Benzonatate) for cough suppression. -Start Flonase (Fluticasone) nasal spray for possible post-nasal drip contributing to cough. -Continue Albuterol as needed for wheezing. -If cough persists for another 7-10 days, obtain chest x-ray if cough persists past another week.        Follow up 7-10 day or sooner if needed.

## 2023-10-02 ENCOUNTER — Ambulatory Visit (INDEPENDENT_AMBULATORY_CARE_PROVIDER_SITE_OTHER): Payer: BC Managed Care – PPO | Admitting: Physician Assistant

## 2023-10-02 ENCOUNTER — Ambulatory Visit: Payer: BC Managed Care – PPO | Admitting: Family Medicine

## 2023-10-02 ENCOUNTER — Encounter: Payer: Self-pay | Admitting: Physician Assistant

## 2023-10-02 VITALS — BP 153/82 | HR 55 | Temp 98.0°F | Ht 67.0 in | Wt 199.5 lb

## 2023-10-02 DIAGNOSIS — R058 Other specified cough: Secondary | ICD-10-CM

## 2023-10-02 MED ORDER — HYDROCODONE BIT-HOMATROP MBR 5-1.5 MG/5ML PO SOLN: 5.0000 mL | Freq: Every evening | ORAL | 0 refills | Status: DC | PRN

## 2023-10-02 NOTE — Progress Notes (Signed)
Established patient visit   Patient: Manuel Foster   DOB: March 29, 1961   62 y.o. Male  MRN: [id number] Visit Date: 10/02/2023  Today's healthcare provider: Alfredia Ferguson, PA-C   Chief Complaint  Patient presents with   Cough    Has been seen for this before... states that the medication he has been given has not helped. Was covid positive a few weeks back   Subjective     Pt was seen 09/17/23 for cough/cold symptoms, dx with COVID19. Seen again 10/23 for persistent cough, prescribed azithromycin.  Pt reports today overall feels better, but persistent dry cough with some nasal congestion. Denies SOB, chest pain, fevers. He reports being consistent w/ claritin daily.   Medications: Outpatient Medications Prior to Visit  Medication Sig   albuterol (VENTOLIN HFA) 108 (90 Base) MCG/ACT inhaler Inhale 2 puffs into the lungs every 6 (six) hours as needed for wheezing or shortness of breath.   benzonatate (TESSALON) 100 MG capsule Take 1 capsule (100 mg total) by mouth 3 (three) times daily as needed for cough.   cyclobenzaprine (FLEXERIL) 10 MG tablet Take 1 tablet (10 mg total) by mouth 3 (three) times daily as needed for muscle spasms.   fluticasone (FLONASE) 50 MCG/ACT nasal spray Place 2 sprays into both nostrils daily.   fluticasone (FLONASE) 50 MCG/ACT nasal spray Place 2 sprays into both nostrils daily.   loratadine (CLARITIN) 10 MG tablet Take 1 tablet (10 mg total) by mouth daily.   predniSONE (DELTASONE) 20 MG tablet Take 1 tablet (20 mg total) by mouth daily with breakfast.   [DISCONTINUED] HYDROcodone bit-homatropine (HYCODAN) 5-1.5 MG/5ML syrup Take 5 mLs by mouth at bedtime as needed for cough.   No facility-administered medications prior to visit.    Review of Systems  Constitutional:  Negative for fatigue and fever.  HENT:  Positive for congestion.   Respiratory:  Positive for cough. Negative for shortness of breath.   Cardiovascular:  Negative for chest  pain, palpitations and leg swelling.  Neurological:  Negative for dizziness and headaches.       Objective    BP (!) 153/82   Pulse (!) 55   Temp 98 F (36.7 C) (Oral)   Ht 5\' 7"  (1.702 m)   Wt 199 lb 8 oz (90.5 kg)   SpO2 99%   BMI 31.25 kg/m    Physical Exam Constitutional:      General: He is awake.     Appearance: He is well-developed.  HENT:     Head: Normocephalic.     Mouth/Throat:     Pharynx: Posterior oropharyngeal erythema present. No oropharyngeal exudate.  Eyes:     Conjunctiva/sclera: Conjunctivae normal.  Cardiovascular:     Rate and Rhythm: Normal rate and regular rhythm.     Heart sounds: Normal heart sounds.  Pulmonary:     Effort: Pulmonary effort is normal.     Breath sounds: Normal breath sounds.  Skin:    General: Skin is warm.  Neurological:     Mental Status: He is alert and oriented to person, place, and time.  Psychiatric:        Attention and Perception: Attention normal.        Mood and Affect: Mood normal.        Speech: Speech normal.        Behavior: Behavior is cooperative.      No results found for any visits on 10/02/23.  Assessment & Plan  Post-viral cough syndrome -     HYDROcodone Bit-Homatrop MBr; Take 5 mLs by mouth at bedtime as needed for cough.  Dispense: 120 mL; Refill: 0  Explained post viral cough and that his initial COVID has resolved, and abx treatment has treated the secondary bacterial infection. Lungs cta, low sus for pneumonia Recommending cont claritin, nasal sprays, hydration, steam showers. Will send in cough syrup for nightly use.   Return if symptoms worsen or fail to improve.       Alfredia Ferguson, PA-C  [facility] [phone] (phone) [phone] (fax)  [facility]

## 2023-11-14 IMAGING — CT CT HEAD W/O CM
3 series · 14 of 47 positions shown, 16 images · non-contrast
Comparison: None.

CLINICAL DATA: Trauma, headache



[Series 2: head wo · axial · 0.41mm/px · z∈[-199,-74]mm · 8 of 30 slices shown, 10 images]
[im 3/30  brain]
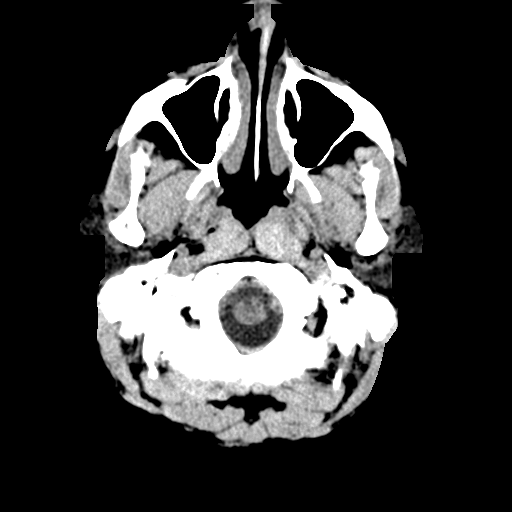
[im 3/30  bone]
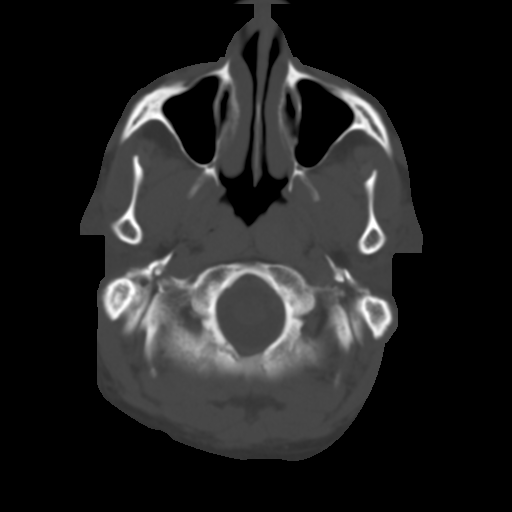
[im 7/30  brain]
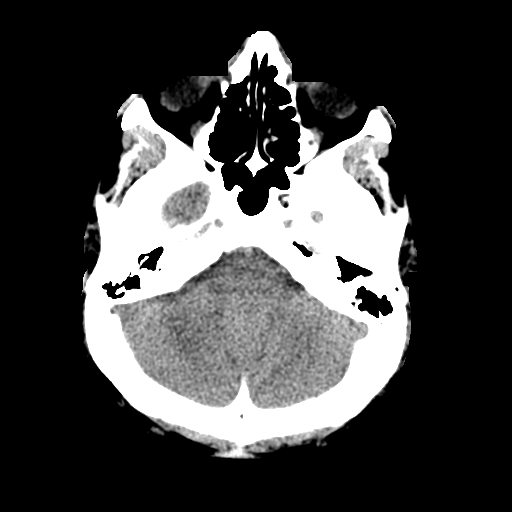
[im 10/30  brain]
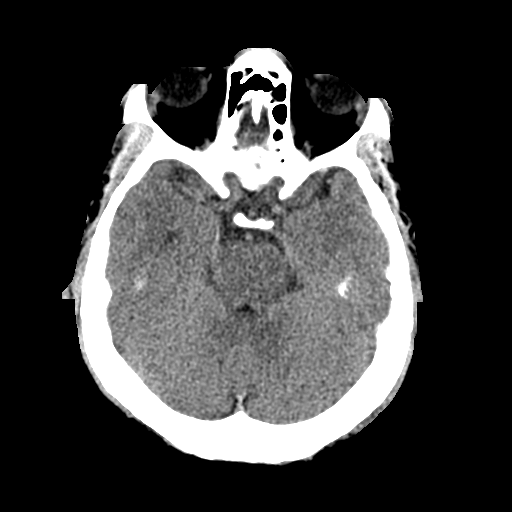
[im 14/30  brain]
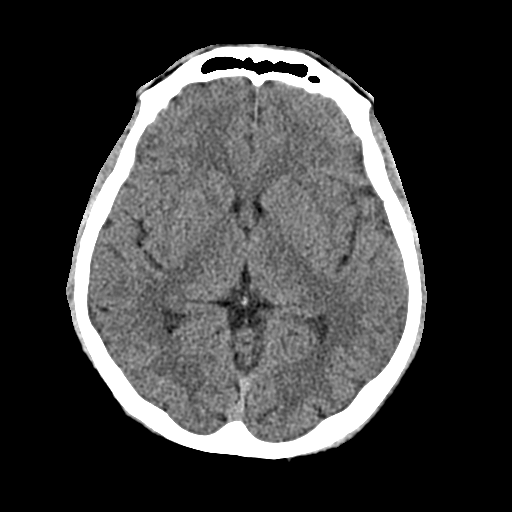
[im 17/30  brain]
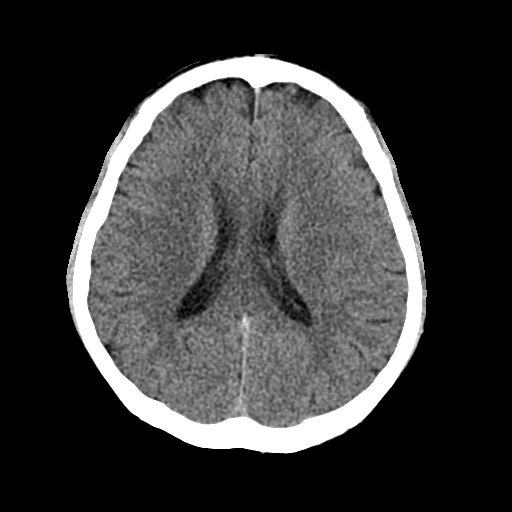
[im 17/30  bone]
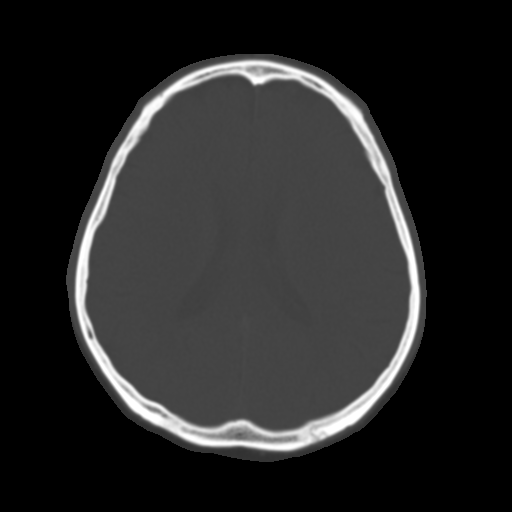
[im 21/30  brain]
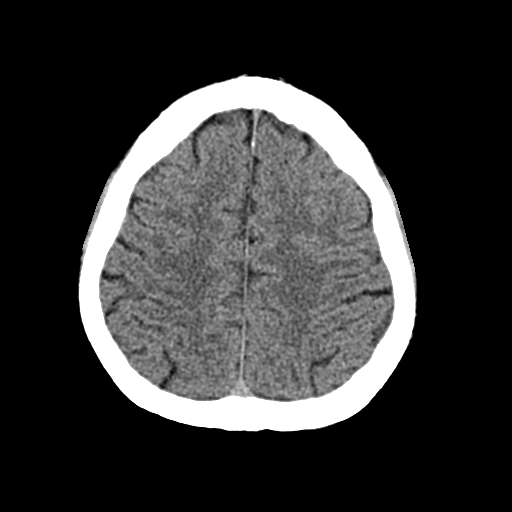
[im 24/30  brain]
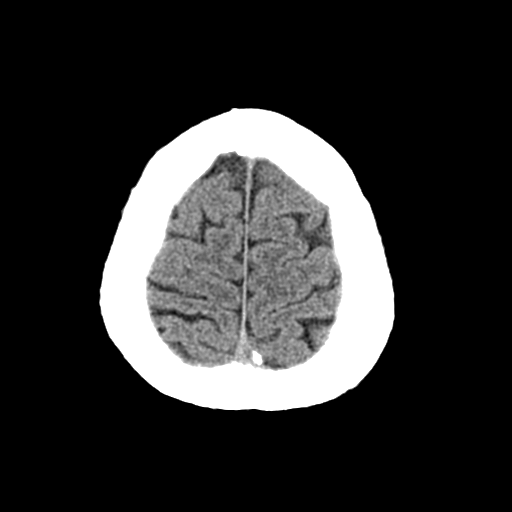
[im 28/30  brain]
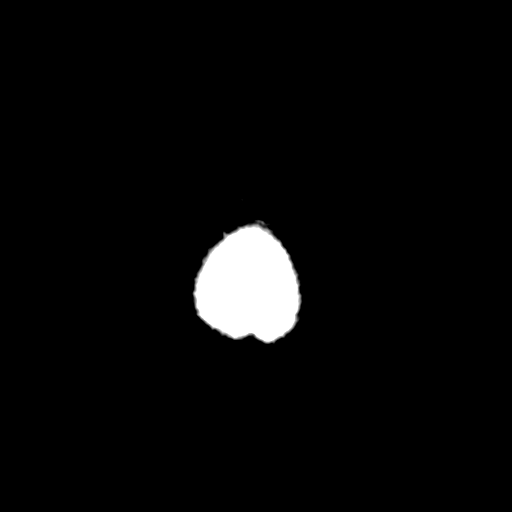

[Series 4: cor soft · coronal · 0.27mm/px · 3 of 64 slices shown]
[im 22/64  brain]
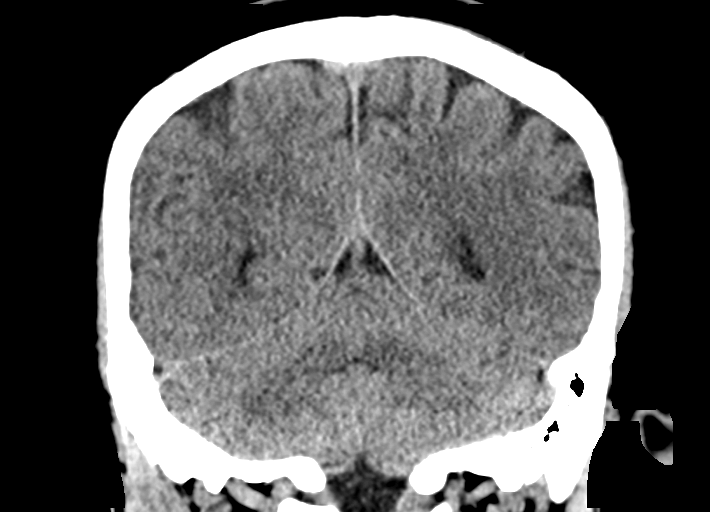
[im 29/64  brain]
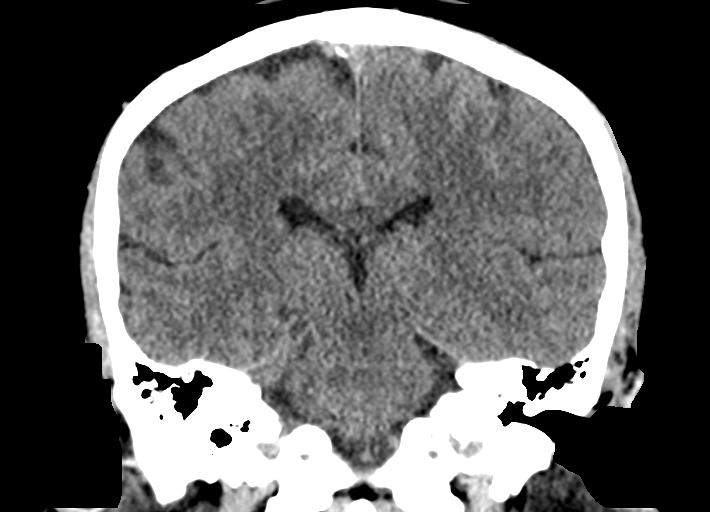
[im 36/64  brain]
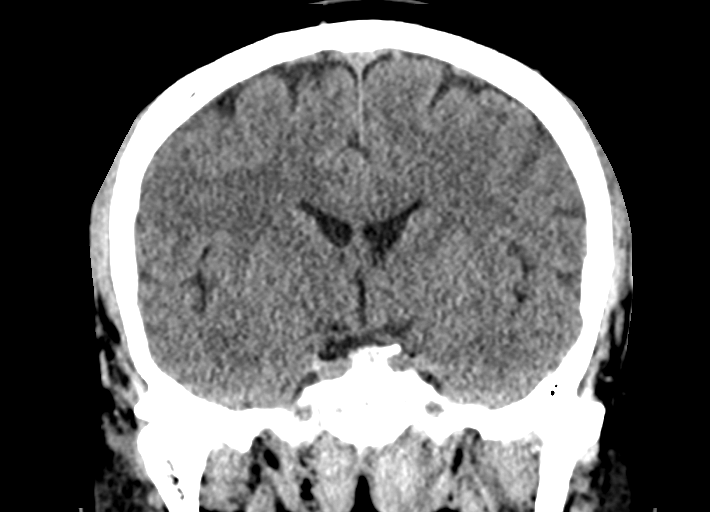

[Series 5: sag soft · sagittal · 0.30mm/px · 3 of 58 slices shown]
[im 20/58  brain]
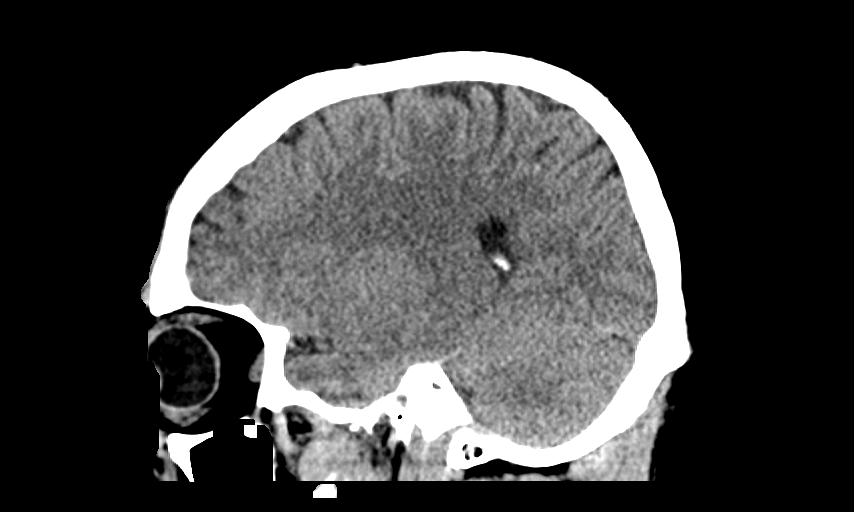
[im 29/58  brain]
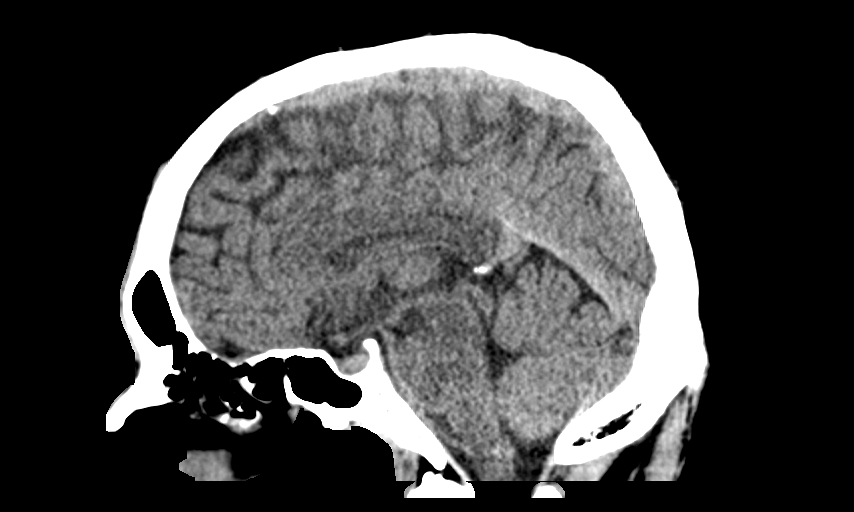
[im 39/58  brain]
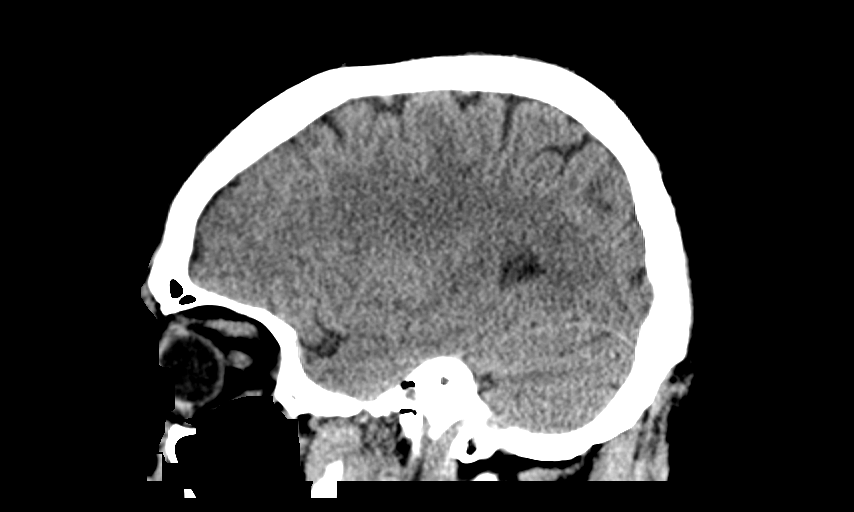

[14 of 47 positions shown; findings below may reference images not displayed]

FINDINGS: Brain: No evidence of acute infarction, hemorrhage, hydrocephalus,
extra-axial collection or mass lesion/mass effect.

Vascular: No hyperdense vessel or unexpected calcification.

Skull: Normal. Negative for fracture or focal lesion.

Sinuses/Orbits: No acute finding.

Other: None.
IMPRESSION: Normal head CT without contrast for age. No acute intracranial
abnormality.

## 2024-03-11 ENCOUNTER — Ambulatory Visit: Payer: Self-pay

## 2024-03-11 NOTE — Telephone Encounter (Signed)
  Chief Complaint: back pain Symptoms: lower back pain, radiates down LE at times Frequency: unsure of time frame Pertinent Negatives: NA Disposition: [] ED /[] Urgent Care (no appt availability in office) / [x] Appointment(In office/virtual)/ []  [place] [facility]/ [] Home Care/ [] Refused Recommended Disposition /[] [place] [facility]/ []  Follow-up with PCP Additional Notes: spoke with pt's wife Alona Bene. Advised didn't see updated DPR so just spoke briefly with her on pt sx. She schedules his appts for him since he has 2 jobs. Pt is working both jobs tomorrow so unable to come in. Appt for 03/15/24 already scheduled prior to NT so recommended if pain severe and unable to wait until Monday, pt could go to local UC over the weekend. Gave wife hours for [facility] weekend and advised if pt does go to call Monday AM to cancel OV. She verbalized understanding.   Copied from CRM [id number]. Topic: Appointments - Appointment Scheduling >> Mar 11, 2024  3:47 PM Priscille Kluver wrote: Patient/patient representative is calling to schedule an appointment. Refer to attachments for appointment information. Patient has been having some lower back pain issues, also when he moves a certain way pain down his leg shoots down, has been going on since Monday. Reason for Disposition  [1] MODERATE back pain (e.g., interferes with normal activities) AND [2] present > 3 days  Protocols used: Back Pain-A-AH

## 2024-03-15 ENCOUNTER — Ambulatory Visit: Admitting: Family Medicine

## 2024-03-15 ENCOUNTER — Encounter: Payer: Self-pay | Admitting: Family Medicine

## 2024-03-15 VITALS — BP 160/90 | HR 90 | Temp 98.6°F | Resp 18 | Ht 67.0 in | Wt 203.2 lb

## 2024-03-15 DIAGNOSIS — M5441 Lumbago with sciatica, right side: Secondary | ICD-10-CM | POA: Diagnosis not present

## 2024-03-15 MED ORDER — PREDNISONE 10 MG PO TABS
ORAL_TABLET | ORAL | 0 refills | Status: DC
Start: 2024-03-15 — End: 2024-09-03

## 2024-03-15 MED ORDER — CYCLOBENZAPRINE HCL 10 MG PO TABS: 10.0000 mg | ORAL_TABLET | Freq: Three times a day (TID) | ORAL | 0 refills | Status: DC | PRN

## 2024-03-15 NOTE — Progress Notes (Signed)
 Established Patient Office Visit  Subjective   Patient ID: Manuel Foster, male    DOB: 1961-04-08  Age: 63 y.o. MRN: [id number]  Chief Complaint  Patient presents with   Back Pain    Pt states having low back pain and leg pain, pain since last week, no falls or injuries    HPI Discussed the use of AI scribe software for clinical note transcription with the patient, who gave verbal consent to proceed.  History of Present Illness Manuel Foster is a 63 year old male who presents with acute lower back pain radiating to the right leg. He is accompanied by his wife.  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 The pain is localized to the right side, just above the belt line, and occasionally radiates down his right leg, sometimes extending to his foot but more often stopping at the back of the knee or thigh.  The pain is intermittent and feels like a cramp or spasm, severe enough at times to almost cause him to collapse. He struggles to tie his shoes due to the pain, requiring support from the bathtub. The pain occasionally spreads across his waist, causing significant discomfort.  He has tried using [employer], which provided some initial relief, with his wife assisting in its application. He has a history of using cyclobenzaprine (Flexeril) as a muscle relaxer, which was prescribed in the past and found helpful for similar symptoms.  No pain when lifting knees or pushing against resistance. Able to walk on toes and heels without difficulty. No history of falls or specific injuries. His wife and pastor have expressed concern, but he reassures them that he has not fallen or injured himself.   Patient Active Problem List   Diagnosis Date Noted   Acute right-sided low back pain with right-sided sciatica 03/15/2024   Head injury 03/29/2022   COVID-19 07/10/2021   Fungal infection of  skin 10/07/2019   COVID-19 virus detected 05/31/2019   Loss of weight 05/29/2017   Otitis media 05/11/2008   DERMATITIS 09/04/2007   DERMATITIS, CONTACT, DUE TO PLANTS 05/20/2007   CELLULITIS/ABSCESS, SITE NEC 05/06/2007   Past Medical History:  Diagnosis Date   Bronchitis    Frequent headaches    Seizures (HCC)    As a child   Past Surgical History:  Procedure Laterality Date   EYE SURGERY Left    eyelid surgery   WISDOM TOOTH EXTRACTION     Social History   Tobacco Use   Smoking status: Never   Smokeless tobacco: Never  Substance Use Topics   Alcohol use: No    Alcohol/week: 0.0 standard drinks of alcohol   Drug use: No   Social History   Socioeconomic History   Marital status: Married    Spouse name: Not on file   Number of children: Not on file   Years of education: Not on file   Highest education level: Not on file  Occupational History    Employer: [employer]    Comment: P/T    Comment: palate and box  Tobacco Use   Smoking status: Never   Smokeless tobacco: Never  Substance and Sexual Activity   Alcohol use: No    Alcohol/week: 0.0 standard drinks of alcohol   Drug use: No   Sexual activity: Yes    Partners: Female  Other Topics Concern   Not on file  Social History Narrative   Exercise-- work with ups   Social Drivers of Corporate investment banker Strain: Not on file  Food Insecurity: Not on file  Transportation Needs: Not on file  Physical Activity: Not on file  Stress: Not on file  Social Connections: Not on file  Intimate Partner Violence: Not on file   Family Status  Relation Name Status   Mother  Deceased   Father  Deceased       cause unknown   Sister  Alive   Brother  Deceased   Sister  Alive   Sister  Alive   Sister  Deceased   Sister  Deceased at age 35       b/l pneumonia   Brother  Deceased   Brother  Alive   Brother  Alive   Brother  Alive  No partnership data on file   Family History  Problem Relation Age of Onset    Stroke Mother    Diabetes Father    Alcohol abuse Brother    Diabetes Sister    Alcohol abuse Brother    No Known Allergies    Review of Systems  Constitutional:  Negative for fever and malaise/fatigue.  HENT:  Negative for congestion.   Eyes:  Negative for blurred vision.  Respiratory:  Negative for cough and shortness of breath.   Cardiovascular:  Negative for chest pain, palpitations and leg swelling.  Gastrointestinal:  Negative for vomiting.  Musculoskeletal:  Positive for back pain.  Skin:  Negative for rash.  Neurological:  Negative for loss of consciousness and headaches.      Objective:     BP (!) 160/90 (BP Location: Right Arm, Patient Position: Sitting, Cuff Size: Normal)   Pulse 90   Temp 98.6 F (37 C) (Oral)   Resp 18   Ht 5\' 7"  (1.702 m)   Wt 203 lb 3.2 oz (92.2 kg)   SpO2 97%   BMI 31.83 kg/m  BP Readings from Last 3 Encounters:  03/15/24 (!) 160/90  10/02/23 (!) 153/82  09/24/23 (!) 140/80   Wt Readings from Last 3 Encounters:  03/15/24 203 lb 3.2 oz (92.2 kg)  10/02/23 199 lb 8 oz (90.5 kg)  09/24/23 198 lb 6.4 oz (90 kg)   SpO2 Readings from Last 3 Encounters:  03/15/24 97%  10/02/23 99%  09/24/23 98%      Physical Exam Vitals and nursing note reviewed.  Constitutional:      General: He is not in acute distress.    Appearance: Normal appearance. He is well-developed.  HENT:     Head: Normocephalic and atraumatic.  Eyes:     General: No scleral icterus.       Right eye: No discharge.        Left eye: No discharge.  Cardiovascular:     Rate and Rhythm: Normal rate and regular rhythm.     Heart sounds: No murmur heard. Pulmonary:     Effort: Pulmonary effort is normal. No respiratory distress.     Breath sounds: Normal breath sounds.  Musculoskeletal:        General: Tenderness present. Normal range of motion.     Cervical back: Normal range of motion and neck supple.     Thoracic back: Spasms and tenderness present. No deformity  or signs of trauma. Normal range of motion.     Right lower leg: No edema.     Left lower leg: No edema.  Skin:  General: Skin is warm and dry.  Neurological:     Mental Status: He is alert and oriented to person, place, and time.  Psychiatric:        Mood and Affect: Mood normal.        Behavior: Behavior normal.        Thought Content: Thought content normal.        Judgment: Judgment normal.      No results found for any visits on 03/15/24.  Last CBC Lab Results  Component Value Date   WBC 6.7 05/29/2017   HGB 13.3 05/29/2017   HCT 39.6 05/29/2017   MCV 85.0 05/29/2017   MCH 28.2 06/29/2016   RDW 13.7 05/29/2017   PLT 270.0 05/29/2017   Last metabolic panel Lab Results  Component Value Date   GLUCOSE 78 05/29/2017   NA 141 05/29/2017   K 4.0 05/29/2017   CL 104 05/29/2017   CO2 29 05/29/2017   BUN 17 05/29/2017   CREATININE 1.08 05/29/2017   GFR 91.77 05/29/2017   CALCIUM 9.7 05/29/2017   PROT 7.8 05/29/2017   ALBUMIN 4.4 05/29/2017   BILITOT 0.6 05/29/2017   ALKPHOS 62 05/29/2017   AST 31 05/29/2017   ALT 24 05/29/2017   Last lipids Lab Results  Component Value Date   CHOL 245 (H) 03/07/2015   HDL 59.30 03/07/2015   LDLCALC 175 (H) 03/07/2015   TRIG 52.0 03/07/2015   CHOLHDL 4 03/07/2015   Last hemoglobin A1c No results found for: "HGBA1C" Last thyroid functions Lab Results  Component Value Date   TSH 1.74 05/29/2017   Last vitamin D No results found for: "25OHVITD2", "25OHVITD3", "VD25OH" Last vitamin B12 and Folate No results found for: "VITAMINB12", "FOLATE"    The ASCVD Risk score (Arnett DK, et al., 2019) failed to calculate for the following reasons:   Cannot find a previous HDL lab   Cannot find a previous total cholesterol lab    Assessment & Plan:   Problem List Items Addressed This Visit       Unprioritized   Acute right-sided low back pain with right-sided sciatica - Primary   FLEXERIL REFILLED HEAT/ ICE PRED TAPER   RETURN TO OFFICE 2 WEEKS AS NEEDED       Relevant Medications   predniSONE (DELTASONE) 10 MG tablet  Assessment and Plan Assessment & Plan Low back pain with radiculopathy   He presents with right-sided low back pain radiating down the leg to the foot, intermittent and severe, likely muscular in origin due to good leg strength and absence of trauma. The pain is associated with muscle spasms and worsens with movements like leaning or tying shoes, suggesting a muscle strain or spasm. Conservative management is planned with medications and lifestyle modifications. Prescribe prednisone to reduce inflammation and refill cyclobenzaprine for muscle relaxation, to be taken at night due to its sedative effects. Recommend topical treatments such as Icy Hot, Aspercreme with lidocaine, or Salonpas patches with lidocaine. Advise against lifting objects over ten pounds and encourage stretching exercises. Consider x-rays if symptoms do not improve or worsen. Follow up in two weeks or sooner if symptoms do not improve.    Return in about 2 weeks (around 03/29/2024), or if symptoms worsen or fail to improve.    [name], DO

## 2024-03-15 NOTE — Patient Instructions (Signed)
 Acute Back Pain, Adult Acute back pain is sudden and usually short-lived. It is often caused by an injury to the muscles and tissues in the back. The injury may result from: A muscle, tendon, or ligament getting overstretched or torn. Ligaments are tissues that connect bones to each other. Lifting something improperly can cause a back strain. Wear and tear (degeneration) of the spinal disks. Spinal disks are circular tissue that provide cushioning between the bones of the spine (vertebrae). Twisting motions, such as while playing sports or doing yard work. A hit to the back. Arthritis. You may have a physical exam, lab tests, and imaging tests to find the cause of your pain. Acute back pain usually goes away with rest and home care. Follow these instructions at home: Managing pain, stiffness, and swelling Take over-the-counter and prescription medicines only as told by your health care provider. Treatment may include medicines for pain and inflammation that are taken by mouth or applied to the skin, or muscle relaxants. Your health care provider may recommend applying ice during the first 24-48 hours after your pain starts. To do this: Put ice in a plastic bag. Place a towel between your skin and the bag. Leave the ice on for 20 minutes, 2-3 times a day. Remove the ice if your skin turns bright red. This is very important. If you cannot feel pain, heat, or cold, you have a greater risk of damage to the area. If directed, apply heat to the affected area as often as told by your health care provider. Use the heat source that your health care provider recommends, such as a moist heat pack or a heating pad. Place a towel between your skin and the heat source. Leave the heat on for 20-30 minutes. Remove the heat if your skin turns bright red. This is especially important if you are unable to feel pain, heat, or cold. You have a greater risk of getting burned. Activity  Do not stay in bed. Staying in  bed for more than 1-2 days can delay your recovery. Sit up and stand up straight. Avoid leaning forward when you sit or hunching over when you stand. If you work at a desk, sit close to it so you do not need to lean over. Keep your chin tucked in. Keep your neck drawn back, and keep your elbows bent at a 90-degree angle (right angle). Sit high and close to the steering wheel when you drive. Add lower back (lumbar) support to your car seat, if needed. Take short walks on even surfaces as soon as you are able. Try to increase the length of time you walk each day. Do not sit, drive, or stand in one place for more than 30 minutes at a time. Sitting or standing for long periods of time can put stress on your back. Do not drive or use heavy machinery while taking prescription pain medicine. Use proper lifting techniques. When you bend and lift, use positions that put less stress on your back: [place] your knees. Keep the load close to your body. Avoid twisting. Exercise regularly as told by your health care provider. Exercising helps your back heal faster and helps prevent back injuries by keeping muscles strong and flexible. Work with a physical therapist to make a safe exercise program, as recommended by your health care provider. Do any exercises as told by your physical therapist. Lifestyle Maintain a healthy weight. Extra weight puts stress on your back and makes it difficult to have good  posture. Avoid activities or situations that make you feel anxious or stressed. Stress and anxiety increase muscle tension and can make back pain worse. Learn ways to manage anxiety and stress, such as through exercise. General instructions Sleep on a firm mattress in a comfortable position. Try lying on your side with your knees slightly bent. If you lie on your back, put a pillow under your knees. Keep your head and neck in a straight line with your spine (neutral position) when using electronic equipment like  smartphones or pads. To do this: Raise your smartphone or pad to look at it instead of bending your head or neck to look down. Put the smartphone or pad at the level of your face while looking at the screen. Follow your treatment plan as told by your health care provider. This may include: Cognitive or behavioral therapy. Acupuncture or massage therapy. Meditation or yoga. Contact a health care provider if: You have pain that is not relieved with rest or medicine. You have increasing pain going down into your legs or buttocks. Your pain does not improve after 2 weeks. You have pain at night. You lose weight without trying. You have a fever or chills. You develop nausea or vomiting. You develop abdominal pain. Get help right away if: You develop new bowel or bladder control problems. You have unusual weakness or numbness in your arms or legs. You feel faint. These symptoms may represent a serious problem that is an emergency. Do not wait to see if the symptoms will go away. Get medical help right away. Call your local emergency services (911 in the [place] Do not drive yourself to the hospital. Summary Acute back pain is sudden and usually short-lived. Use proper lifting techniques. When you bend and lift, use positions that put less stress on your back. Take over-the-counter and prescription medicines only as told by your health care provider, and apply heat or ice as told. This information is not intended to replace advice given to you by your health care provider. Make sure you discuss any questions you have with your health care provider. Document Revised: 02/09/2021 Document Reviewed: 02/09/2021 Elsevier Patient Education  2024 [employer].

## 2024-03-15 NOTE — Assessment & Plan Note (Signed)
 FLEXERIL REFILLED HEAT/ ICE PRED TAPER  RETURN TO OFFICE 2 WEEKS AS NEEDED

## 2024-03-30 ENCOUNTER — Ambulatory Visit: Admitting: Family Medicine

## 2024-09-03 ENCOUNTER — Ambulatory Visit: Admitting: Family Medicine

## 2024-09-03 ENCOUNTER — Encounter: Payer: Self-pay | Admitting: Family Medicine

## 2024-09-03 VITALS — BP 122/70 | HR 67 | Temp 98.3°F | Resp 18 | Ht 67.0 in | Wt 201.6 lb

## 2024-09-03 DIAGNOSIS — U071 COVID-19: Secondary | ICD-10-CM

## 2024-09-03 DIAGNOSIS — R051 Acute cough: Secondary | ICD-10-CM | POA: Diagnosis not present

## 2024-09-03 DIAGNOSIS — J40 Bronchitis, not specified as acute or chronic: Secondary | ICD-10-CM

## 2024-09-03 DIAGNOSIS — J029 Acute pharyngitis, unspecified: Secondary | ICD-10-CM | POA: Diagnosis not present

## 2024-09-03 DIAGNOSIS — R0981 Nasal congestion: Secondary | ICD-10-CM | POA: Diagnosis not present

## 2024-09-03 LAB — POC COVID19 BINAXNOW: SARS Coronavirus 2 Ag: NEGATIVE

## 2024-09-03 LAB — POCT RAPID STREP A (OFFICE): Rapid Strep A Screen: POSITIVE — AB

## 2024-09-03 MED ORDER — HYDROCODONE BIT-HOMATROP MBR 5-1.5 MG/5ML PO SOLN: 5.0000 mL | Freq: Four times a day (QID) | ORAL | 0 refills | Status: AC | PRN

## 2024-09-03 MED ORDER — ALBUTEROL SULFATE HFA 108 (90 BASE) MCG/ACT IN AERS: 2.0000 | INHALATION_SPRAY | Freq: Four times a day (QID) | RESPIRATORY_TRACT | 2 refills | Status: AC | PRN

## 2024-09-03 MED ORDER — AZITHROMYCIN 250 MG PO TABS
ORAL_TABLET | ORAL | 0 refills | Status: AC
Start: 2024-09-03 — End: ?

## 2024-09-03 NOTE — Progress Notes (Signed)
 Subjective:    Patient ID: Manuel Foster, male    DOB: 1961-02-14, 63 y.o.   MRN: [id number]  Chief Complaint  Patient presents with   Sore Throat    Sxs started Sunday, no covid or flu test, pain with swallowing. No meds otc. No fever    HPI Patient is in today for sore throat.  Discussed the use of AI scribe software for clinical note transcription with the patient, who gave verbal consent to proceed.  History of Present Illness Manuel Foster is a 63 year old male who presents with a cough and sore throat.  He has been experiencing a non-productive cough that began on Sunday evening, approximately five days ago. The cough is severe enough to disrupt his sleep at night. He denies fever but has a runny nose with clear discharge and no nasal congestion. He has been using Flonase  for his symptoms.  He reports a sore throat that has not been swabbed prior to this visit. He has not taken any over-the-counter medications for his symptoms.  He experiences slight chest tightness and a 'teeny tiny' wheeze. He is currently out of his inhaler medication. He reports that when his symptoms started Sunday evening, he began coughing more, similar to his prior episodes of bronchitis.    Past Medical History:  Diagnosis Date   Bronchitis    Frequent headaches    Seizures (HCC)    As a child    Past Surgical History:  Procedure Laterality Date   EYE SURGERY Left    eyelid surgery   WISDOM TOOTH EXTRACTION      Family History  Problem Relation Age of Onset   Stroke Mother    Diabetes Father    Alcohol abuse Brother    Diabetes Sister    Alcohol abuse Brother     Social History   Socioeconomic History   Marital status: Married    Spouse name: Not on file   Number of children: Not on file   Years of education: Not on file   Highest education level: Not on file  Occupational History    Employer: [employer]    Comment: P/T    Comment: palate and box  Tobacco Use   Smoking  status: Never   Smokeless tobacco: Never  Substance and Sexual Activity   Alcohol use: No    Alcohol/week: 0.0 standard drinks of alcohol   Drug use: No   Sexual activity: Yes    Partners: Female  Other Topics Concern   Not on file  Social History Narrative   Exercise-- work with ups   Social Drivers of Health   Financial Resource Strain: Not on file  Food Insecurity: Not on file  Transportation Needs: Not on file  Physical Activity: Not on file  Stress: Not on file  Social Connections: Not on file  Intimate Partner Violence: Not on file    Outpatient Medications Prior to Visit  Medication Sig Dispense Refill   fluticasone  (FLONASE ) 50 MCG/ACT nasal spray Place 2 sprays into both nostrils daily. 16 g 6   fluticasone  (FLONASE ) 50 MCG/ACT nasal spray Place 2 sprays into both nostrils daily. 16 g 1   loratadine  (CLARITIN ) 10 MG tablet Take 1 tablet (10 mg total) by mouth daily. 30 tablet 11   albuterol  (VENTOLIN  HFA) 108 (90 Base) MCG/ACT inhaler Inhale 2 puffs into the lungs every 6 (six) hours as needed for wheezing or shortness of breath. 8 g 2   predniSONE  (  DELTASONE ) 10 MG tablet TAKE 3 TABLETS PO QD FOR 3 DAYS THEN TAKE 2 TABLETS PO QD FOR 3 DAYS THEN TAKE 1 TABLET PO QD FOR 3 DAYS THEN TAKE 1/2 TAB PO QD FOR 3 DAYS 20 tablet 0   No facility-administered medications prior to visit.    No Known Allergies  Review of Systems  Constitutional:  Negative for fever and malaise/fatigue.  HENT:  Positive for congestion and sore throat.   Eyes:  Negative for blurred vision.  Respiratory:  Positive for cough, sputum production and wheezing. Negative for shortness of breath.   Cardiovascular:  Negative for chest pain, palpitations and leg swelling.  Gastrointestinal:  Negative for vomiting.  Musculoskeletal:  Negative for back pain.  Skin:  Negative for rash.  Neurological:  Negative for loss of consciousness and headaches.       Objective:    Physical Exam Vitals and  nursing note reviewed.  Constitutional:      General: He is not in acute distress.    Appearance: Normal appearance. He is well-developed.  HENT:     Head: Normocephalic and atraumatic.  Eyes:     General: No scleral icterus.       Right eye: No discharge.        Left eye: No discharge.  Cardiovascular:     Rate and Rhythm: Normal rate and regular rhythm.     Heart sounds: No murmur heard. Pulmonary:     Effort: Pulmonary effort is normal. No respiratory distress.     Breath sounds: Normal breath sounds.  Musculoskeletal:        General: Normal range of motion.     Cervical back: Normal range of motion and neck supple.     Right lower leg: No edema.     Left lower leg: No edema.  Skin:    General: Skin is warm and dry.  Neurological:     Mental Status: He is alert and oriented to person, place, and time.  Psychiatric:        Mood and Affect: Mood normal.        Behavior: Behavior normal.        Thought Content: Thought content normal.        Judgment: Judgment normal.     BP 122/70 (BP Location: Left Arm, Patient Position: Sitting, Cuff Size: Normal)   Pulse 67   Temp 98.3 F (36.8 C) (Oral)   Resp 18   Ht 5' 7 (1.702 m)   Wt 201 lb 9.6 oz (91.4 kg)   SpO2 97%   BMI 31.58 kg/m  Wt Readings from Last 3 Encounters:  09/03/24 201 lb 9.6 oz (91.4 kg)  03/15/24 203 lb 3.2 oz (92.2 kg)  10/02/23 199 lb 8 oz (90.5 kg)    Diabetic Foot Exam - Simple   No data filed    Lab Results  Component Value Date   WBC 6.7 05/29/2017   HGB 13.3 05/29/2017   HCT 39.6 05/29/2017   PLT 270.0 05/29/2017   GLUCOSE 78 05/29/2017   CHOL 245 (H) 03/07/2015   TRIG 52.0 03/07/2015   HDL 59.30 03/07/2015   LDLCALC 175 (H) 03/07/2015   ALT 24 05/29/2017   AST 31 05/29/2017   NA 141 05/29/2017   K 4.0 05/29/2017   CL 104 05/29/2017   CREATININE 1.08 05/29/2017   BUN 17 05/29/2017   CO2 29 05/29/2017   TSH 1.74 05/29/2017   PSA 0.61 05/29/2017    Lab Results  Component  Value Date   TSH 1.74 05/29/2017   Lab Results  Component Value Date   WBC 6.7 05/29/2017   HGB 13.3 05/29/2017   HCT 39.6 05/29/2017   MCV 85.0 05/29/2017   PLT 270.0 05/29/2017   Lab Results  Component Value Date   NA 141 05/29/2017   K 4.0 05/29/2017   CO2 29 05/29/2017   GLUCOSE 78 05/29/2017   BUN 17 05/29/2017   CREATININE 1.08 05/29/2017   BILITOT 0.6 05/29/2017   ALKPHOS 62 05/29/2017   AST 31 05/29/2017   ALT 24 05/29/2017   PROT 7.8 05/29/2017   ALBUMIN 4.4 05/29/2017   CALCIUM 9.7 05/29/2017   GFR 91.77 05/29/2017   Lab Results  Component Value Date   CHOL 245 (H) 03/07/2015   Lab Results  Component Value Date   HDL 59.30 03/07/2015   Lab Results  Component Value Date   LDLCALC 175 (H) 03/07/2015   Lab Results  Component Value Date   TRIG 52.0 03/07/2015   Lab Results  Component Value Date   CHOLHDL 4 03/07/2015   No results found for: HGBA1C     Assessment & Plan:  Assessment and Plan Assessment & Plan Acute streptococcal pharyngitis   A positive strep test confirms acute streptococcal pharyngitis, presenting with a sore throat and cough but no fever.  Acute bronchitis   Cough began Sunday evening, likely due to bronchitis. It is non-productive and disrupts sleep, with mild wheezing and chest tightness. A negative COVID test allows for bronchitis treatment. Address bronchitis symptoms.----  refill albuterol   Allergic rhinitis   Nasal symptoms include clear rhinorrhea without congestion. He is using Flonase  but is out of his inhaler.  Refill albuterol     Joscelyn Hardrick R Lowne Chase, DO

## 2024-11-05 ENCOUNTER — Ambulatory Visit: Payer: Self-pay

## 2024-11-05 NOTE — Telephone Encounter (Signed)
 FYI Only or Action Required?: FYI only for provider: appointment scheduled on 11/08/2024.  Patient was last seen in primary care on 09/03/2024 by Antonio Meth, Jamee SAUNDERS, DO.  Called Nurse Triage reporting Cough.  Symptoms began several days ago.  Interventions attempted: Nothing.  Symptoms are: unchanged.  Triage Disposition: Home Care  Patient/caregiver understands and will follow disposition?: No, wishes to speak with PCP  Copied from CRM #[id number]. Topic: Clinical - Red Word Triage >> Nov 05, 2024  9:55 AM Adelita E wrote: [facility] that prompted transfer to Nurse Triage: Worsening dry cough, throat pain, and runny nose. Symptoms going on for 3 days. Reason for Disposition  Common cold with no complications  Answer Assessment - Initial Assessment Questions 1. ONSET: When did the nasal discharge start?      Three days ago 2. AMOUNT: How much discharge is there?      Runny nose, not clear on the amount 3. COUGH: Do you have a cough? If Yes, ask: Describe the color of your mucus. (e.g., clear, white, yellow, green)     Dry cough 4. RESPIRATORY DISTRESS: Describe your breathing.      normal 5. FEVER: Do you have a fever? If Yes, ask: What is your temperature, how was it measured, and when did it start?     denies 6. SEVERITY: Overall, how bad are you feeling right now? (e.g., doesn't interfere with normal activities, staying home from school/work, staying in bed)     Feels moderately bad 7. OTHER SYMPTOMS: Do you have any other symptoms? (e.g., earache, mouth sores, sore throat, wheezing)     Sore throat  Protocols used: Common Cold-A-AH

## 2024-11-05 NOTE — Telephone Encounter (Signed)
 Appt scheduled

## 2024-11-08 ENCOUNTER — Ambulatory Visit: Admitting: Family

## 2024-11-08 ENCOUNTER — Encounter: Payer: Self-pay | Admitting: Family

## 2024-11-08 VITALS — BP 148/84 | HR 67 | Temp 97.7°F | Resp 16 | Ht 67.0 in | Wt 203.8 lb

## 2024-11-08 DIAGNOSIS — R051 Acute cough: Secondary | ICD-10-CM

## 2024-11-08 DIAGNOSIS — J208 Acute bronchitis due to other specified organisms: Secondary | ICD-10-CM

## 2024-11-08 MED ORDER — METHYLPREDNISOLONE 4 MG PO TBPK: ORAL_TABLET | ORAL | Status: AC

## 2024-11-08 MED ORDER — PROMETHAZINE-DM 6.25-15 MG/5ML PO SYRP: 5.0000 mL | ORAL_SOLUTION | Freq: Four times a day (QID) | ORAL | 0 refills | Status: AC | PRN

## 2024-11-08 NOTE — Progress Notes (Unsigned)
 Acute Office Visit  Subjective:     Patient ID: Manuel Foster, male    DOB: Apr 24, 1961, 63 y.o.   MRN: [id number]  Chief Complaint  Patient presents with  . Cough    Patient is here for a persistent cough - 3 day occurence Runny nose, sore throat    HPI Patient is in today with c/o cough, congestion, wheezing, runny nose and sore throat x 3 days. Has been taking OTC medication without much relief. No known sick contacts. No fever or chills.  Review of Systems  Constitutional:  Negative for chills and fever.  HENT:  Positive for congestion.   Respiratory:  Positive for cough and wheezing.   Gastrointestinal: Negative.   Musculoskeletal: Negative.   Skin: Negative.   Neurological: Negative.   Endo/Heme/Allergies: Negative.   Psychiatric/Behavioral: Negative.    All other systems reviewed and are negative.   Past Medical History:  Diagnosis Date  . Bronchitis   . Frequent headaches   . Seizures (HCC)    As a child    Social History   Socioeconomic History  . Marital status: Married    Spouse name: Not on file  . Number of children: Not on file  . Years of education: Not on file  . Highest education level: Not on file  Occupational History    Employer: [employer]    Comment: P/T    Comment: palate and box  Tobacco Use  . Smoking status: Never  . Smokeless tobacco: Never  Substance and Sexual Activity  . Alcohol use: No    Alcohol/week: 0.0 standard drinks of alcohol  . Drug use: No  . Sexual activity: Yes    Partners: Female  Other Topics Concern  . Not on file  Social History Narrative   Exercise-- work with ups   Social Drivers of Health   Financial Resource Strain: Not on file  Food Insecurity: Not on file  Transportation Needs: Not on file  Physical Activity: Not on file  Stress: Not on file  Social Connections: Not on file  Intimate Partner Violence: Not on file    Past Surgical History:  Procedure Laterality Date  . EYE SURGERY Left     eyelid surgery  . WISDOM TOOTH EXTRACTION      Family History  Problem Relation Age of Onset  . Stroke Mother   . Diabetes Father   . Alcohol abuse Brother   . Diabetes Sister   . Alcohol abuse Brother     No Known Allergies  Current Outpatient Medications on File Prior to Visit  Medication Sig Dispense Refill  . albuterol  (VENTOLIN  HFA) 108 (90 Base) MCG/ACT inhaler Inhale 2 puffs into the lungs every 6 (six) hours as needed for wheezing or shortness of breath. 8 g 2  . azithromycin  (ZITHROMAX  Z-PAK) 250 MG tablet As directed 6 each 0  . fluticasone  (FLONASE ) 50 MCG/ACT nasal spray Place 2 sprays into both nostrils daily. 16 g 6  . fluticasone  (FLONASE ) 50 MCG/ACT nasal spray Place 2 sprays into both nostrils daily. 16 g 1  . HYDROcodone  bit-homatropine (HYDROMET) 5-1.5 MG/5ML syrup Take 5 mLs by mouth every 6 (six) hours as needed for cough. 120 mL 0  . loratadine  (CLARITIN ) 10 MG tablet Take 1 tablet (10 mg total) by mouth daily. 30 tablet 11   No current facility-administered medications on file prior to visit.    BP (!) 148/84 (BP Location: Left Arm, Patient Position: Sitting, Cuff Size: Large)  Pulse 67   Temp 97.7 F (36.5 C) (Oral)   Resp 16   Ht 5' 7 (1.702 m)   Wt 203 lb 12.8 oz (92.4 kg)   SpO2 97%   BMI 31.92 kg/m chart     Objective:    BP (!) 148/84 (BP Location: Left Arm, Patient Position: Sitting, Cuff Size: Large)   Pulse 67   Temp 97.7 F (36.5 C) (Oral)   Resp 16   Ht 5' 7 (1.702 m)   Wt 203 lb 12.8 oz (92.4 kg)   SpO2 97%   BMI 31.92 kg/m    Physical Exam Vitals and nursing note reviewed.  Constitutional:      Appearance: Normal appearance. He is normal weight.  HENT:     Right Ear: Tympanic membrane, ear canal and external ear normal.     Left Ear: Tympanic membrane, ear canal and external ear normal.     Nose: Congestion present.     Mouth/Throat:     Mouth: Mucous membranes are moist.  Cardiovascular:     Rate and Rhythm: Normal  rate and regular rhythm.     Pulses: Normal pulses.     Heart sounds: Normal heart sounds.  Pulmonary:     Effort: Pulmonary effort is normal.     Breath sounds: Normal breath sounds. No wheezing.  Musculoskeletal:        General: Normal range of motion.     Cervical back: Normal range of motion and neck supple.  Skin:    General: Skin is warm and dry.  Neurological:     General: No focal deficit present.     Mental Status: He is alert and oriented to person, place, and time. Mental status is at baseline.  Psychiatric:        Mood and Affect: Mood normal.        Behavior: Behavior normal.        Thought Content: Thought content normal.    No results found for any visits on 11/08/24.      Assessment & Plan:   Problem List Items Addressed This Visit   None Visit Diagnoses       Acute viral bronchitis    -  Primary     Acute cough           Meds ordered this encounter  Medications  . methylPREDNISolone  (MEDROL  DOSEPAK) 4 MG TBPK tablet    Sig: As directed    Dispense:  21 tablet    Refill:  00  . promethazine -dextromethorphan (PROMETHAZINE -DM) 6.25-15 MG/5ML syrup    Sig: Take 5 mLs by mouth 4 (four) times daily as needed.    Dispense:  118 mL    Refill:  0   Call the office with any questions or concerns, recheck as scheduled and sooner as needed.  No follow-ups on file.  Jatavia Keltner B Brance Dartt, FNP
